# Patient Record
Sex: Female | Born: 1935 | Race: White | Hispanic: No | Smoking: Former smoker
Health system: Southern US, Community
[De-identification: ages and names within clinical notes are randomized; demographics above are authoritative.]

## PROBLEM LIST (undated history)

## (undated) DIAGNOSIS — F419 Anxiety disorder, unspecified: Secondary | ICD-10-CM

## (undated) DIAGNOSIS — K219 Gastro-esophageal reflux disease without esophagitis: Secondary | ICD-10-CM

## (undated) DIAGNOSIS — D510 Vitamin B12 deficiency anemia due to intrinsic factor deficiency: Secondary | ICD-10-CM

## (undated) DIAGNOSIS — I5032 Chronic diastolic (congestive) heart failure: Secondary | ICD-10-CM

## (undated) DIAGNOSIS — I10 Essential (primary) hypertension: Secondary | ICD-10-CM

## (undated) DIAGNOSIS — H34239 Retinal artery branch occlusion, unspecified eye: Secondary | ICD-10-CM

## (undated) DIAGNOSIS — F329 Major depressive disorder, single episode, unspecified: Secondary | ICD-10-CM

## (undated) DIAGNOSIS — M1990 Unspecified osteoarthritis, unspecified site: Secondary | ICD-10-CM

## (undated) DIAGNOSIS — M48061 Spinal stenosis, lumbar region without neurogenic claudication: Secondary | ICD-10-CM

## (undated) DIAGNOSIS — E785 Hyperlipidemia, unspecified: Secondary | ICD-10-CM

## (undated) DIAGNOSIS — J45909 Unspecified asthma, uncomplicated: Secondary | ICD-10-CM

## (undated) DIAGNOSIS — F32A Depression, unspecified: Secondary | ICD-10-CM

## (undated) HISTORY — DX: Spinal stenosis, lumbar region without neurogenic claudication: M48.061

## (undated) HISTORY — DX: Retinal artery branch occlusion, unspecified eye: H34.239

## (undated) HISTORY — DX: Gastro-esophageal reflux disease without esophagitis: K21.9

## (undated) HISTORY — DX: Chronic diastolic (congestive) heart failure: I50.32

## (undated) HISTORY — DX: Major depressive disorder, single episode, unspecified: F32.9

## (undated) HISTORY — DX: Unspecified osteoarthritis, unspecified site: M19.90

## (undated) HISTORY — DX: Hyperlipidemia, unspecified: E78.5

## (undated) HISTORY — DX: Anxiety disorder, unspecified: F41.9

## (undated) HISTORY — DX: Unspecified asthma, uncomplicated: J45.909

## (undated) HISTORY — DX: Vitamin B12 deficiency anemia due to intrinsic factor deficiency: D51.0

## (undated) HISTORY — DX: Essential (primary) hypertension: I10

## (undated) HISTORY — DX: Depression, unspecified: F32.A

## (undated) HISTORY — PX: TONSILLECTOMY: SUR1361

---

## 1982-08-20 HISTORY — PX: ABDOMINAL HYSTERECTOMY: SHX81

## 1997-08-20 HISTORY — PX: OTHER SURGICAL HISTORY: SHX169

## 2001-08-20 DIAGNOSIS — I10 Essential (primary) hypertension: Secondary | ICD-10-CM

## 2001-08-20 HISTORY — DX: Essential (primary) hypertension: I10

## 2004-06-07 ENCOUNTER — Ambulatory Visit: Payer: Self-pay | Admitting: Anesthesiology

## 2004-07-20 ENCOUNTER — Ambulatory Visit: Payer: Self-pay | Admitting: Neurosurgery

## 2005-01-11 ENCOUNTER — Ambulatory Visit: Payer: Self-pay | Admitting: Unknown Physician Specialty

## 2006-01-18 ENCOUNTER — Ambulatory Visit: Payer: Self-pay | Admitting: Unknown Physician Specialty

## 2006-07-02 ENCOUNTER — Encounter: Payer: Self-pay | Admitting: Internal Medicine

## 2007-03-06 ENCOUNTER — Ambulatory Visit: Payer: Self-pay | Admitting: Unknown Physician Specialty

## 2007-04-01 ENCOUNTER — Ambulatory Visit: Payer: Self-pay | Admitting: Internal Medicine

## 2007-04-01 DIAGNOSIS — M48061 Spinal stenosis, lumbar region without neurogenic claudication: Secondary | ICD-10-CM

## 2007-04-01 DIAGNOSIS — F411 Generalized anxiety disorder: Secondary | ICD-10-CM | POA: Insufficient documentation

## 2007-04-01 DIAGNOSIS — I10 Essential (primary) hypertension: Secondary | ICD-10-CM | POA: Insufficient documentation

## 2007-04-01 DIAGNOSIS — F329 Major depressive disorder, single episode, unspecified: Secondary | ICD-10-CM

## 2007-04-01 DIAGNOSIS — E785 Hyperlipidemia, unspecified: Secondary | ICD-10-CM | POA: Insufficient documentation

## 2007-04-01 DIAGNOSIS — K219 Gastro-esophageal reflux disease without esophagitis: Secondary | ICD-10-CM | POA: Insufficient documentation

## 2007-04-01 DIAGNOSIS — M1990 Unspecified osteoarthritis, unspecified site: Secondary | ICD-10-CM | POA: Insufficient documentation

## 2007-04-01 DIAGNOSIS — D510 Vitamin B12 deficiency anemia due to intrinsic factor deficiency: Secondary | ICD-10-CM

## 2007-04-04 ENCOUNTER — Encounter: Payer: Self-pay | Admitting: Internal Medicine

## 2007-04-26 ENCOUNTER — Encounter: Payer: Self-pay | Admitting: Internal Medicine

## 2007-04-29 ENCOUNTER — Ambulatory Visit: Payer: Self-pay | Admitting: Internal Medicine

## 2007-05-30 ENCOUNTER — Ambulatory Visit: Payer: Self-pay | Admitting: Internal Medicine

## 2007-06-30 ENCOUNTER — Ambulatory Visit: Payer: Self-pay | Admitting: Internal Medicine

## 2007-07-25 ENCOUNTER — Telehealth (INDEPENDENT_AMBULATORY_CARE_PROVIDER_SITE_OTHER): Payer: Self-pay | Admitting: *Deleted

## 2007-07-28 ENCOUNTER — Telehealth: Payer: Self-pay | Admitting: Internal Medicine

## 2007-07-29 ENCOUNTER — Telehealth (INDEPENDENT_AMBULATORY_CARE_PROVIDER_SITE_OTHER): Payer: Self-pay | Admitting: *Deleted

## 2007-08-04 ENCOUNTER — Ambulatory Visit: Payer: Self-pay | Admitting: Internal Medicine

## 2007-08-04 LAB — CONVERTED CEMR LAB
Albumin: 4 g/dL (ref 3.5–5.2)
BUN: 34 mg/dL — ABNORMAL HIGH (ref 6–23)
CO2: 25 meq/L (ref 19–32)
Eosinophils Absolute: 0.3 10*3/uL (ref 0.0–0.6)
GFR calc non Af Amer: 42 mL/min
Hemoglobin: 11.5 g/dL — ABNORMAL LOW (ref 12.0–15.0)
Lymphocytes Relative: 27.5 % (ref 12.0–46.0)
MCV: 86.2 fL (ref 78.0–100.0)
Monocytes Absolute: 1.1 10*3/uL — ABNORMAL HIGH (ref 0.2–0.7)
Monocytes Relative: 11.6 % — ABNORMAL HIGH (ref 3.0–11.0)
Neutro Abs: 5.7 10*3/uL (ref 1.4–7.7)
Phosphorus: 3.6 mg/dL (ref 2.3–4.6)
Platelets: 224 10*3/uL (ref 150–400)
Potassium: 4.9 meq/L (ref 3.5–5.1)
Sodium: 139 meq/L (ref 135–145)

## 2007-09-04 ENCOUNTER — Ambulatory Visit: Payer: Self-pay | Admitting: Internal Medicine

## 2007-10-07 ENCOUNTER — Ambulatory Visit: Payer: Self-pay | Admitting: Internal Medicine

## 2007-10-29 ENCOUNTER — Ambulatory Visit: Payer: Self-pay | Admitting: Internal Medicine

## 2007-10-29 ENCOUNTER — Telehealth (INDEPENDENT_AMBULATORY_CARE_PROVIDER_SITE_OTHER): Payer: Self-pay | Admitting: *Deleted

## 2007-10-29 DIAGNOSIS — E119 Type 2 diabetes mellitus without complications: Secondary | ICD-10-CM | POA: Insufficient documentation

## 2007-10-29 DIAGNOSIS — T7840XA Allergy, unspecified, initial encounter: Secondary | ICD-10-CM | POA: Insufficient documentation

## 2007-10-29 LAB — CONVERTED CEMR LAB: Glucose, Bld: 213 mg/dL

## 2007-11-07 ENCOUNTER — Ambulatory Visit: Payer: Self-pay | Admitting: Internal Medicine

## 2007-12-08 ENCOUNTER — Ambulatory Visit: Payer: Self-pay | Admitting: Internal Medicine

## 2008-01-08 ENCOUNTER — Ambulatory Visit: Payer: Self-pay | Admitting: Internal Medicine

## 2008-02-09 ENCOUNTER — Ambulatory Visit: Payer: Self-pay | Admitting: Internal Medicine

## 2008-02-10 LAB — CONVERTED CEMR LAB
ALT: 12 units/L (ref 0–35)
AST: 14 units/L (ref 0–37)
Albumin: 3.9 g/dL (ref 3.5–5.2)
Alkaline Phosphatase: 48 units/L (ref 39–117)
BUN: 30 mg/dL — ABNORMAL HIGH (ref 6–23)
Basophils Relative: 0.9 % (ref 0.0–1.0)
Bilirubin, Direct: 0.1 mg/dL (ref 0.0–0.3)
Calcium: 9.1 mg/dL (ref 8.4–10.5)
Chloride: 105 meq/L (ref 96–112)
Creatinine, Ser: 1.2 mg/dL (ref 0.4–1.2)
Eosinophils Absolute: 0.5 10*3/uL (ref 0.0–0.7)
Eosinophils Relative: 6.1 % — ABNORMAL HIGH (ref 0.0–5.0)
GFR calc Af Amer: 56 mL/min
GFR calc non Af Amer: 46 mL/min
HDL: 43.2 mg/dL (ref >39.0)
Lymphocytes Relative: 29.8 % (ref 12.0–46.0)
MCV: 87.3 fL (ref 78.0–100.0)
Monocytes Relative: 11.9 % (ref 3.0–12.0)
Neutrophils Relative %: 51.3 % (ref 43.0–77.0)
Phosphorus: 3.5 mg/dL (ref 2.3–4.6)
Platelets: 216 10*3/uL (ref 150–400)
RBC: 3.83 M/uL — ABNORMAL LOW (ref 3.87–5.11)
Total CHOL/HDL Ratio: 3.8
Total Protein: 6.7 g/dL (ref 6.0–8.3)
VLDL: 40 mg/dL (ref 0–40)
WBC: 8.5 10*3/uL (ref 4.5–10.5)

## 2008-02-18 ENCOUNTER — Ambulatory Visit: Payer: Self-pay | Admitting: Family Medicine

## 2008-03-24 ENCOUNTER — Ambulatory Visit: Payer: Self-pay | Admitting: Internal Medicine

## 2008-03-30 ENCOUNTER — Encounter: Payer: Self-pay | Admitting: Internal Medicine

## 2008-03-30 ENCOUNTER — Telehealth (INDEPENDENT_AMBULATORY_CARE_PROVIDER_SITE_OTHER): Payer: Self-pay | Admitting: *Deleted

## 2008-04-29 ENCOUNTER — Ambulatory Visit: Payer: Self-pay | Admitting: Internal Medicine

## 2008-04-29 ENCOUNTER — Telehealth: Payer: Self-pay | Admitting: Internal Medicine

## 2008-05-11 ENCOUNTER — Encounter: Payer: Self-pay | Admitting: Internal Medicine

## 2008-05-11 ENCOUNTER — Ambulatory Visit: Payer: Self-pay | Admitting: Internal Medicine

## 2008-05-12 ENCOUNTER — Encounter (INDEPENDENT_AMBULATORY_CARE_PROVIDER_SITE_OTHER): Payer: Self-pay | Admitting: *Deleted

## 2008-06-07 ENCOUNTER — Ambulatory Visit: Payer: Self-pay | Admitting: Internal Medicine

## 2008-06-07 LAB — CONVERTED CEMR LAB
Albumin: 3.8 g/dL (ref 3.5–5.2)
BUN: 33 mg/dL — ABNORMAL HIGH (ref 6–23)
CO2: 26 meq/L (ref 19–32)
Calcium: 9.1 mg/dL (ref 8.4–10.5)
GFR calc non Af Amer: 42 mL/min
Glucose, Bld: 101 mg/dL — ABNORMAL HIGH (ref 70–99)
Sodium: 140 meq/L (ref 135–145)

## 2008-06-15 ENCOUNTER — Ambulatory Visit: Payer: Self-pay

## 2008-06-15 ENCOUNTER — Encounter: Payer: Self-pay | Admitting: Internal Medicine

## 2008-07-12 ENCOUNTER — Ambulatory Visit: Payer: Self-pay | Admitting: Internal Medicine

## 2008-08-16 ENCOUNTER — Ambulatory Visit: Payer: Self-pay | Admitting: Internal Medicine

## 2008-09-22 ENCOUNTER — Ambulatory Visit: Payer: Self-pay | Admitting: Internal Medicine

## 2008-10-04 ENCOUNTER — Ambulatory Visit: Payer: Self-pay | Admitting: Internal Medicine

## 2008-10-05 LAB — CONVERTED CEMR LAB
AST: 13 units/L (ref 0–37)
Alkaline Phosphatase: 45 units/L (ref 39–117)
BUN: 31 mg/dL — ABNORMAL HIGH (ref 6–23)
Basophils Absolute: 0.1 10*3/uL (ref 0.0–0.1)
Bilirubin, Direct: 0.1 mg/dL (ref 0.0–0.3)
CO2: 26 meq/L (ref 19–32)
Calcium: 9.3 mg/dL (ref 8.4–10.5)
Chloride: 108 meq/L (ref 96–112)
Creatinine, Ser: 1.2 mg/dL (ref 0.4–1.2)
Eosinophils Absolute: 0.6 10*3/uL (ref 0.0–0.7)
Eosinophils Relative: 6.2 % — ABNORMAL HIGH (ref 0.0–5.0)
HDL: 43.4 mg/dL (ref >39.0)
Lymphocytes Relative: 28.2 % (ref 12.0–46.0)
MCHC: 33.5 g/dL (ref 30.0–36.0)
MCV: 87.4 fL (ref 78.0–100.0)
Neutrophils Relative %: 51.1 % (ref 43.0–77.0)
Platelets: 197 10*3/uL (ref 150–400)
TSH: 1.48 microintl units/mL (ref 0.35–5.50)
Triglycerides: 203 mg/dL (ref 0–149)
VLDL: 41 mg/dL — ABNORMAL HIGH (ref 0–40)
WBC: 9.3 10*3/uL (ref 4.5–10.5)

## 2008-10-26 ENCOUNTER — Ambulatory Visit: Payer: Self-pay | Admitting: Internal Medicine

## 2008-10-27 ENCOUNTER — Encounter: Payer: Self-pay | Admitting: Internal Medicine

## 2008-10-29 ENCOUNTER — Telehealth: Payer: Self-pay | Admitting: Family Medicine

## 2008-11-16 ENCOUNTER — Telehealth: Payer: Self-pay | Admitting: Internal Medicine

## 2008-12-02 ENCOUNTER — Ambulatory Visit: Payer: Self-pay | Admitting: Internal Medicine

## 2009-01-05 ENCOUNTER — Ambulatory Visit: Payer: Self-pay | Admitting: Internal Medicine

## 2009-02-09 ENCOUNTER — Ambulatory Visit: Payer: Self-pay | Admitting: Internal Medicine

## 2009-04-11 ENCOUNTER — Ambulatory Visit: Payer: Self-pay | Admitting: Internal Medicine

## 2009-04-12 LAB — CONVERTED CEMR LAB
AST: 17 units/L (ref 0–37)
Basophils Relative: 0.7 % (ref 0.0–3.0)
CO2: 26 meq/L (ref 19–32)
Chloride: 110 meq/L (ref 96–112)
Creatinine, Ser: 1.4 mg/dL — ABNORMAL HIGH (ref 0.4–1.2)
Direct LDL: 135 mg/dL
Eosinophils Relative: 7.3 % — ABNORMAL HIGH (ref 0.0–5.0)
HCT: 33.1 % — ABNORMAL LOW (ref 36.0–46.0)
Hemoglobin: 10.8 g/dL — ABNORMAL LOW (ref 12.0–15.0)
MCV: 88.9 fL (ref 78.0–100.0)
Monocytes Absolute: 1 10*3/uL (ref 0.1–1.0)
Neutro Abs: 3.7 10*3/uL (ref 1.4–7.7)
Neutrophils Relative %: 48.5 % (ref 43.0–77.0)
Potassium: 4.5 meq/L (ref 3.5–5.1)
RBC: 3.72 M/uL — ABNORMAL LOW (ref 3.87–5.11)
Sodium: 142 meq/L (ref 135–145)
Total Bilirubin: 1.1 mg/dL (ref 0.3–1.2)
Total CHOL/HDL Ratio: 5
Triglycerides: 207 mg/dL — ABNORMAL HIGH (ref 0.0–149.0)
WBC: 7.8 10*3/uL (ref 4.5–10.5)

## 2009-04-26 ENCOUNTER — Telehealth: Payer: Self-pay | Admitting: Internal Medicine

## 2009-05-20 ENCOUNTER — Ambulatory Visit: Payer: Self-pay | Admitting: Internal Medicine

## 2009-06-14 ENCOUNTER — Telehealth: Payer: Self-pay | Admitting: Internal Medicine

## 2009-09-07 ENCOUNTER — Ambulatory Visit: Payer: Self-pay | Admitting: Internal Medicine

## 2009-09-09 LAB — CONVERTED CEMR LAB
AST: 18 units/L (ref 0–37)
Albumin: 3.9 g/dL (ref 3.5–5.2)
Alkaline Phosphatase: 43 units/L (ref 39–117)
Basophils Relative: 1.1 % (ref 0.0–3.0)
Chloride: 110 meq/L (ref 96–112)
Eosinophils Relative: 7 % — ABNORMAL HIGH (ref 0.0–5.0)
GFR calc non Af Amer: 46.17 mL/min (ref >60)
Hemoglobin: 10.5 g/dL — ABNORMAL LOW (ref 12.0–15.0)
Hgb A1c MFr Bld: 6.2 % (ref 4.6–6.5)
Lymphocytes Relative: 29.2 % (ref 12.0–46.0)
Monocytes Relative: 10.6 % (ref 3.0–12.0)
Neutro Abs: 3.6 10*3/uL (ref 1.4–7.7)
Phosphorus: 3.6 mg/dL (ref 2.3–4.6)
Potassium: 5.1 meq/L (ref 3.5–5.1)
RBC: 3.6 M/uL — ABNORMAL LOW (ref 3.87–5.11)
TSH: 1.5 microintl units/mL (ref 0.35–5.50)
Total Protein: 6.7 g/dL (ref 6.0–8.3)

## 2009-12-05 ENCOUNTER — Telehealth: Payer: Self-pay | Admitting: Internal Medicine

## 2009-12-14 ENCOUNTER — Ambulatory Visit: Payer: Self-pay | Admitting: Internal Medicine

## 2009-12-14 DIAGNOSIS — R0609 Other forms of dyspnea: Secondary | ICD-10-CM

## 2009-12-14 DIAGNOSIS — R0989 Other specified symptoms and signs involving the circulatory and respiratory systems: Secondary | ICD-10-CM | POA: Insufficient documentation

## 2009-12-23 ENCOUNTER — Encounter: Payer: Self-pay | Admitting: Internal Medicine

## 2010-01-09 ENCOUNTER — Ambulatory Visit: Payer: Self-pay | Admitting: Internal Medicine

## 2010-01-09 DIAGNOSIS — J45909 Unspecified asthma, uncomplicated: Secondary | ICD-10-CM | POA: Insufficient documentation

## 2010-02-06 ENCOUNTER — Ambulatory Visit: Payer: Self-pay | Admitting: Internal Medicine

## 2010-02-14 ENCOUNTER — Telehealth: Payer: Self-pay | Admitting: Internal Medicine

## 2010-02-16 ENCOUNTER — Telehealth: Payer: Self-pay | Admitting: Internal Medicine

## 2010-03-08 ENCOUNTER — Encounter: Payer: Self-pay | Admitting: Internal Medicine

## 2010-04-05 ENCOUNTER — Encounter: Payer: Self-pay | Admitting: Internal Medicine

## 2010-04-11 ENCOUNTER — Encounter: Payer: Self-pay | Admitting: Internal Medicine

## 2010-04-19 ENCOUNTER — Ambulatory Visit: Payer: Self-pay | Admitting: Internal Medicine

## 2010-04-20 HISTORY — PX: TOTAL KNEE ARTHROPLASTY: SHX125

## 2010-04-25 ENCOUNTER — Telehealth: Payer: Self-pay | Admitting: Internal Medicine

## 2010-04-28 ENCOUNTER — Ambulatory Visit: Payer: Self-pay | Admitting: Orthopedic Surgery

## 2010-05-01 ENCOUNTER — Ambulatory Visit: Payer: Self-pay

## 2010-05-04 ENCOUNTER — Inpatient Hospital Stay: Payer: Self-pay | Admitting: Orthopedic Surgery

## 2010-06-28 ENCOUNTER — Emergency Department: Payer: Self-pay | Admitting: Unknown Physician Specialty

## 2010-06-28 ENCOUNTER — Encounter: Payer: Self-pay | Admitting: Internal Medicine

## 2010-07-07 ENCOUNTER — Ambulatory Visit: Payer: Self-pay | Admitting: Internal Medicine

## 2010-07-08 ENCOUNTER — Encounter: Payer: Self-pay | Admitting: Internal Medicine

## 2010-08-01 ENCOUNTER — Encounter: Payer: Self-pay | Admitting: Internal Medicine

## 2010-09-17 LAB — CONVERTED CEMR LAB
Albumin: 4 g/dL (ref 3.5–5.2)
BUN: 22 mg/dL (ref 6–23)
Basophils Absolute: 0.1 10*3/uL (ref 0.0–0.1)
Basophils Absolute: 0.1 10*3/uL (ref 0.0–0.1)
Basophils Absolute: 0.1 10*3/uL (ref 0.0–0.1)
Bilirubin Urine: NEGATIVE
CO2: 23 meq/L (ref 19–32)
CO2: 25 meq/L (ref 19–32)
CO2: 26 meq/L (ref 19–32)
Calcium: 8.8 mg/dL (ref 8.4–10.5)
Chloride: 107 meq/L (ref 96–112)
Chloride: 111 meq/L (ref 96–112)
Cholesterol: 159 mg/dL (ref 0–200)
Creatinine, Ser: 1.5 mg/dL — ABNORMAL HIGH (ref 0.4–1.2)
Eosinophils Absolute: 0.5 10*3/uL (ref 0.0–0.6)
Eosinophils Absolute: 0.6 10*3/uL (ref 0.0–0.7)
GFR calc Af Amer: 62 mL/min
GFR calc non Af Amer: 35.66 mL/min (ref >60)
Glucose, Bld: 100 mg/dL — ABNORMAL HIGH (ref 70–99)
Glucose, Bld: 93 mg/dL (ref 70–99)
Glucose, Bld: 98 mg/dL (ref 70–99)
HCT: 27.6 % — ABNORMAL LOW (ref 36.0–46.0)
HCT: 30.5 % — ABNORMAL LOW (ref 36.0–46.0)
HCT: 32.7 % — ABNORMAL LOW (ref 36.0–46.0)
Hemoglobin, Urine: NEGATIVE
Hemoglobin: 10.9 g/dL — ABNORMAL LOW (ref 12.0–15.0)
Hemoglobin: 9.4 g/dL — ABNORMAL LOW (ref 12.0–15.0)
Ketones, ur: NEGATIVE mg/dL
Lymphocytes Relative: 29.6 % (ref 12.0–46.0)
Lymphocytes Relative: 34.5 % (ref 12.0–46.0)
Lymphs Abs: 2.3 10*3/uL (ref 0.7–4.0)
Lymphs Abs: 2.3 10*3/uL (ref 0.7–4.0)
MCHC: 33.4 g/dL (ref 30.0–36.0)
MCHC: 34 g/dL (ref 30.0–36.0)
MCV: 85.7 fL (ref 78.0–100.0)
MCV: 88.5 fL (ref 78.0–100.0)
Monocytes Absolute: 0.9 10*3/uL — ABNORMAL HIGH (ref 0.2–0.7)
Monocytes Absolute: 1 10*3/uL (ref 0.1–1.0)
Monocytes Relative: 9.6 % (ref 3.0–12.0)
Neutro Abs: 3.4 10*3/uL (ref 1.4–7.7)
Neutro Abs: 4.6 10*3/uL (ref 1.4–7.7)
Neutrophils Relative %: 46 % (ref 43.0–77.0)
Nitrite: NEGATIVE
Phosphorus: 3.2 mg/dL (ref 2.3–4.6)
Phosphorus: 5.1 mg/dL — ABNORMAL HIGH (ref 2.3–4.6)
Platelets: 192 10*3/uL (ref 150.0–400.0)
Platelets: 212 10*3/uL (ref 150.0–400.0)
Potassium: 4.7 meq/L (ref 3.5–5.1)
Potassium: 4.8 meq/L (ref 3.5–5.1)
Potassium: 5.2 meq/L — ABNORMAL HIGH (ref 3.5–5.1)
Pro B Natriuretic peptide (BNP): 83 pg/mL (ref 0.0–100.0)
RBC: 3.82 M/uL — ABNORMAL LOW (ref 3.87–5.11)
RDW: 13.1 % (ref 11.5–14.6)
RDW: 13.4 % (ref 11.5–14.6)
Sodium: 141 meq/L (ref 135–145)
Sodium: 142 meq/L (ref 135–145)
Sodium: 143 meq/L (ref 135–145)
Total CHOL/HDL Ratio: 3.6
Transferrin: 257.1 mg/dL (ref 212.0–360.0)

## 2010-09-21 NOTE — Assessment & Plan Note (Signed)
Summary: ROA FOR FOLLOW-UP/JRR   Vital Signs:  Patient profile:   77 year old female Weight:      209 pounds O2 Sat:      96 % on Room air Temp:     98.8 degrees F oral Pulse rate:   86 / minute Pulse rhythm:   regular BP sitting:   138 / 62  (left arm) Cuff size:   large  Vitals Entered By: Mervin Hack CMA Duncan Dull) (February 06, 2010 11:25 AM)  O2 Flow:  Room air CC: follow-up visit   History of Present Illness: Feels better with advair Not as much SOB daughter also notes a difference Out to [place] with lots of walking and did fairly well Now she wonders whether she still needs it--per daughter may have just a little AM cough--nothing bad xopenex does help but too expensive  never used albuterol in past  asks about losing some of her meds no heart trouble needs naproxen all the time for arthritis----tries to limit hydrocodone use reviewed evidence for primary prevention of MI  Allergies: 1)  ! Phenergan 2)  ! Sulfa 3)  Meloxicam (Meloxicam)  Past History:  Past medical, surgical, family and social histories (including risk factors) reviewed for relevance to current acute and chronic problems.  Past Medical History: Reviewed history from 01/09/2010 and no changes required. Anxiety Depression GERD Hyperlipidemia Hypertension-- ~2003 Osteoarthritis--knees Lumbar spinal stenosis Pernicious anemia Diabetes mellitus, type II Asthma  Past Surgical History: Reviewed history from 04/01/2007 and no changes required. 1999 Ruptured disk repaired 1984 Hysterectomy Child--tonsillectomy  Family History: Reviewed history from 04/01/2007 and no changes required. Dad died of emphysema @78  Mom died of lung cancer@87  3 brothers --1 with COPD 2 sisters--1 with HTN, balance issues No CAD No other cancer  Social History: Reviewed history from 10/04/2008 and no changes required. 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   Neck:  supple, no masses, no thyromegaly, no carotid bruits, and no cervical lymphadenopathy.   Lungs:  normal respiratory effort, no intercostal retractions, no accessory muscle use, normal breath sounds, no crackles, and no wheezes.   Heart:  normal rate, regular rhythm, no murmur, and no gallop.   Extremities:  no edema Psych:  normally interactive, good eye contact, not anxious appearing, and not depressed appearing.     Impression & Recommendations:  Problem # 1:  ASTHMA (ICD-493.90) Assessment Improved doing better now needs to continue the advair changed rescue inhaler for reduced cost  The following medications were removed from the medication list:    Xopenex Hfa 45 Mcg/act Aero (Levalbuterol tartrate) .Marland Kitchen... 2 puffs by mouth two times a day as needed Her updated medication list for this problem includes:    Advair Diskus 100-50 Mcg/dose Aepb (Fluticasone-salmeterol) .Marland Kitchen... 1 dose inhaled two times a day. please rinse mouth after    Proair Hfa 108 (90 Base) Mcg/act Aers (Albuterol sulfate) .Marland Kitchen... 2 puffs four times daily as needed for shortness of breath  Problem # 2:  OSTEOARTHRITIS (ICD-715.90) Assessment: Unchanged ongoing issues good response from naproxen takes 1/2 hydrocodone as needed ---1 makes her sleepy  Her updated medication list for this problem includes:    Vicodin  5-500 Mg Tabs (Hydrocodone-acetaminophen) .Marland Kitchen... 1/2 - 1 three times a day as needed for severe pain    Naproxen Dr 500 Mg Tbec (Naproxen) .Marland Kitchen... 1 two times a day for knee or back pain  Problem # 3:  HYPERLIPIDEMIA (ICD-272.4) Assessment: Unchanged discussed uncertainty of primary prevention she will continue for now  Her  updated medication list for this problem includes:    Simvastatin 40 Mg Tabs (Simvastatin) .Marland Kitchen... Take 1 tablet by mouth once a day  Labs Reviewed: SGOT: 18 (09/07/2009)   SGPT: 12 (09/07/2009)   HDL:41.40 (04/11/2009), 43.4 (10/04/2008)  LDL:DEL (10/04/2008), 80 (19/14/7829)  Chol:204 (04/11/2009), 173 (10/04/2008)  Trig:207.0 (04/11/2009), 203 (10/04/2008)  Problem # 4:  HYPERTENSION (ICD-401.9) Assessment: Unchanged combine meds to reduce number of pills  The following medications were removed from the medication list:    Triamterene-hctz 37.5-25 Mg Tabs (Triamterene-hctz) .Marland Kitchen... 1 daily    Lisinopril 20 Mg Tabs (Lisinopril) .Marland Kitchen... 1 daily Her updated medication list for this problem includes:    Lisinopril-hydrochlorothiazide 20-25 Mg Tabs (Lisinopril-hydrochlorothiazide) .Marland Kitchen... 1 tab daily for high blood pressure  Complete Medication List: 1)  Lisinopril-hydrochlorothiazide 20-25 Mg Tabs (Lisinopril-hydrochlorothiazide) .Marland Kitchen.. 1 tab daily for high blood pressure 2)  Simvastatin 40 Mg Tabs (Simvastatin) .... Take 1 tablet by mouth once a day 3)  Citalopram Hydrobromide 40 Mg Tabs (Citalopram hydrobromide) .Marland Kitchen.. 1 tab   daily 4)  Vicodin 5-500 Mg Tabs (Hydrocodone-acetaminophen) .... 1/2 - 1 three times a day as needed for severe pain 5)  Advair Diskus 100-50 Mcg/dose Aepb (Fluticasone-salmeterol) .Marland Kitchen.. 1 dose inhaled two times a day. please rinse mouth after 6)  Naproxen Dr 500 Mg Tbec (Naproxen) .Marland Kitchen.. 1 two times a day for knee or back pain 7)  Omeprazole 40 Mg Cpdr (Omeprazole) .... Take 1 by mouth once daily 8)  Cyanocobalamin 1000 Mcg/ml Soln (Cyanocobalamin) .... Inject 1ml monthly 9)  Multivitamins Tabs (Multiple vitamin) 10)  Proair Hfa 108 (90 Base) Mcg/act Aers (Albuterol sulfate) .... 2 puffs four times daily as needed for shortness of breath  Patient Instructions: 1)  Please schedule a follow-up appointment in 4 months .  Prescriptions: LISINOPRIL-HYDROCHLOROTHIAZIDE 20-25 MG  TABS (LISINOPRIL-HYDROCHLOROTHIAZIDE) 1 tab daily for high blood pressure  #30 x 12   Entered and Authorized by:   Cindee Salt MD   Signed by:   Cindee Salt MD on 02/06/2010   Method used:   [facility]        [id number]       [address]       [place], [place]  [address]       Ph: [phone] or [phone]       Fax: [phone]   RxID:   [id number] PROAIR HFA 108 (90 BASE) MCG/ACT AERS (ALBUTEROL SULFATE) 2 puffs four times daily as needed for shortness of breath  #1 x 3   Entered and Authorized by:   Cindee Salt MD   Signed by:   Cindee Salt MD on 02/06/2010   Method used:   [facility]        [id number]       [address]       [place], [place]  [address]       Ph: [phone] or [phone]       Fax:  [phone]   RxID:   [id number]   Current Allergies (reviewed today): ! PHENERGAN ! SULFA MELOXICAM (MELOXICAM)

## 2010-09-21 NOTE — Assessment & Plan Note (Signed)
Summary: F/U DLO   Vital Signs:  Patient profile:   77 year old female Weight:      196 pounds Temp:     98.1 degrees F oral Pulse rate:   84 / minute Pulse rhythm:   regular BP sitting:   138 / 70  (left arm) Cuff size:   large  Vitals Entered By: Mervin Hack CMA Duncan Dull) (July 07, 2010 2:24 PM) CC: follow-up visit   History of Present Illness: Doing well Recovering fairly well from right TKR walking with cane No pain on right--only has pain in left knee now  Chronic anemia Did get 2 units pRBC after the procedure but was anemic before 2 spells of vertigo in the past week went to ER and got CT scan, etc and everything was okay 2nd time son came over and helped has meclizine to use as needed  Recent visit with Dr Reola Calkins okay  No chest pain No SOB No edema now  Had some anxiety while in health care calmer now no sig depression Ongoing stress --has had to establish boundaries with kids  Hasn't been checking sugars was 108 in the ambulance with the vertigo always fine in health care  Allergies: 1)  ! Phenergan 2)  ! Sulfa 3)  Meloxicam (Meloxicam)  Past History:  Past medical, surgical, family and social histories (including risk factors) reviewed for relevance to current acute and chronic problems.  Past Medical History: Reviewed history from 01/09/2010 and no changes required. Anxiety Depression GERD Hyperlipidemia Hypertension-- ~2003 Osteoarthritis--knees Lumbar spinal stenosis Pernicious anemia Diabetes mellitus, type II Asthma  Past Surgical History: 1999 Ruptured disk repaired 1984 Hysterectomy Child--tonsillectomy 9/11 Right TKR   Dr Rosita Kea  Family History: Reviewed history from 04/01/2007 and no changes required. Dad died of emphysema @78  Mom died of lung cancer@87  3 brothers --1 with COPD 2 sisters--1 with HTN, balance issues No CAD No other cancer  Social History: Reviewed history from 10/04/2008 and no changes  required. 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   Neck:  supple, no masses, no thyromegaly, and no cervical lymphadenopathy.   Lungs:  normal respiratory effort, no intercostal retractions, no accessory muscle use, and normal breath sounds.   Heart:  normal rate, regular rhythm, no murmur, and no gallop.   Abdomen:  soft and non-tender.   Extremities:  no edema Psych:  normally interactive, good eye contact, not anxious appearing, and not depressed appearing.     Impression & Recommendations:  Problem # 1:  HYPERTENSION (ICD-401.9) Assessment Unchanged good control no changes needed  Her updated medication list for this problem includes:    Lisinopril-hydrochlorothiazide 20-25 Mg Tabs (Lisinopril-hydrochlorothiazide) .Marland Kitchen... 1 tab daily for high blood pressure  BP today: 138/70 Prior BP: 150/70 (04/19/2010)  Labs Reviewed: K+: 4.7 (04/19/2010) Creat: : 1.5 (04/19/2010)   Chol: 204 (04/11/2009)   HDL: 41.40 (04/11/2009)   LDL: DEL (10/04/2008)   TG: 207.0 (04/11/2009)  Problem # 2:  DIABETES MELLITUS, TYPE II (ICD-250.00) Assessment: Unchanged good control no meds  Her updated medication list for this problem includes:    Lisinopril-hydrochlorothiazide  20-25 Mg Tabs (Lisinopril-hydrochlorothiazide) .Marland Kitchen... 1 tab daily for high blood pressure  Labs Reviewed: Creat: 1.5 (04/19/2010)     Last Eye Exam: no retinopathy (09/20/2008) Reviewed HgBA1c results: 6.1 (12/14/2009)  6.2 (09/07/2009)  Problem # 3:  OSTEOARTHRITIS (ICD-715.90) Assessment: Improved right knee much  better still with pain in left  The following medications were removed from the medication list:    Naproxen Dr 500 Mg Tbec (Naproxen) .Marland Kitchen... 1 two times a day for knee or back pain Her updated medication list for this problem includes:    Vicodin 5-500 Mg Tabs (Hydrocodone-acetaminophen) .Marland Kitchen... 1/2 - 1 three times a day as needed for severe pain  Problem # 4:  DEPRESSION (ICD-311) Assessment: Unchanged mood has been fine will continue the medication  Her updated medication list for this problem includes:    Citalopram Hydrobromide 40 Mg Tabs (Citalopram hydrobromide) .Marland Kitchen... 1 tab   daily  Complete Medication List: 1)  Lisinopril-hydrochlorothiazide 20-25 Mg Tabs (Lisinopril-hydrochlorothiazide) .Marland Kitchen.. 1 tab daily for high blood pressure 2)  Simvastatin 40 Mg Tabs (Simvastatin) .... Take 1 tablet by mouth once a day 3)  Citalopram Hydrobromide 40 Mg Tabs (Citalopram hydrobromide) .Marland Kitchen.. 1 tab   daily 4)  Vicodin 5-500 Mg Tabs (Hydrocodone-acetaminophen) .... 1/2 - 1 three times a day as needed for severe pain 5)  Advair Diskus 100-50 Mcg/dose Aepb (Fluticasone-salmeterol) .Marland Kitchen.. 1 dose inhaled two times a day. please rinse mouth after 6)  Omeprazole 40 Mg Cpdr (Omeprazole) .... Take 1 by mouth once daily 7)  Cyanocobalamin 1000 Mcg/ml Soln (Cyanocobalamin) .... Inject 1ml monthly 8)  Multivitamins Tabs (Multiple vitamin) 9)  Proair Hfa 108 (90 Base) Mcg/act Aers (Albuterol sulfate) .... 2 puffs four times daily as needed for shortness of breath  Patient Instructions: 1)  Please schedule a follow-up appointment in 4 months .    Orders Added: 1)  Est. Patient Level IV [04540]    Current Allergies (reviewed today): ! PHENERGAN ! SULFA MELOXICAM (MELOXICAM)

## 2010-09-21 NOTE — Progress Notes (Signed)
Summary: refill request for vicodin  Phone Note Refill Request Message from:  Fax from Pharmacy  Refills Requested: Medication #1:  VICODIN 5-500 MG  TABS 1/2 - 1 three times a day as needed for severe pain   Last Refilled: 12/05/2009 Faxed request for vicodin is on your desk.  Initial call taken by: Lowella Petties CMA,  February 16, 2010 2:39 PM  Follow-up for Phone Call        okay #90 x 0 Follow-up by: Cindee Salt MD,  February 16, 2010 4:03 PM  Additional Follow-up for Phone Call Additional follow up Details #1::        Faxed request form faxed to [facility] pharmacy [phone] as instructed.Lewanda Rife LPN  February 16, 2010 5:08 PM      Appended Document: refill request for vicodin Faxed request was faxed to [phone] which is [facility]. [facility].

## 2010-09-21 NOTE — Letter (Signed)
Summary: [facility]  [facility]   Imported By: Lanelle Bal 08/22/2010 09:07:09  _____________________________________________________________________  External Attachment:    Type:   Image     Comment:   External Document  Appended Document: [facility] doing well after right TKR

## 2010-09-21 NOTE — Progress Notes (Signed)
Summary: VICODIN  Phone Note [facility] Request [id number] on April 25, 2010 10:57 AM  Refills Requested: Medication #1:  VICODIN 5-500 MG  TABS 1/2 - 1 three times a day as needed for severe pain   Last Refilled: 02/16/2010 Form on your desk    Method Requested: Fax to Local Pharmacy Initial call taken by: DeShannon Smith CMA Duncan Dull),  April 25, 2010 10:57 AM  Follow-up for Phone Call        okay #90 x 0 Follow-up by: Cindee Salt MD,  April 25, 2010 1:51 PM  Additional Follow-up for Phone Call Additional follow up Details #1::        Rx faxed to pharmacy Additional Follow-up by: DeShannon Smith CMA Duncan Dull),  April 25, 2010 2:16 PM    Prescriptions: VICODIN 5-500 MG  TABS (HYDROCODONE-ACETAMINOPHEN) 1/2 - 1 three times a day as needed for severe pain  #90 x 0   Entered by:   Mervin Hack CMA (AAMA)   Authorized by:   Cindee Salt MD   Signed by:   Mervin Hack CMA (AAMA) on 04/25/2010   Method used:   Handwritten   RxID:   [id number]

## 2010-09-21 NOTE — Assessment & Plan Note (Signed)
Summary: 3 M F/U DLO   Vital Signs:  Patient profile:   77 year old female Weight:      212 pounds O2 Sat:      96 % on Room air Temp:     99.1 degrees F oral Pulse rate:   74 / minute Pulse rhythm:   regular BP sitting:   138 / 80  (left arm) Cuff size:   large  Vitals Entered By: Mervin Hack CMA Duncan Dull) (December 14, 2009 12:05 PM)  O2 Flow:  Room air CC: 3 month follow-up/ loose stools   History of Present Illness: Daughter here with her  Not feeling as well Stays tired all the time Very limited exercise tolerance--hard even to walk across her [place] No chest pain No palpitations Notices a gradual worsening  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 Phenergan 2)  ! Sulfa 3)  Meloxicam (Meloxicam)  Past History:  Past medical, surgical, family and social histories (including risk factors) reviewed for relevance to current acute and chronic problems.  Past Medical History: Reviewed history from 10/29/2007 and no changes required. Anxiety Depression GERD Hyperlipidemia Hypertension-- ~2003 Osteoarthritis--knees Lumbar spinal stenosis Pernicious anemia Diabetes mellitus, type II  Past Surgical History: Reviewed history from 04/01/2007 and no changes required. 1999 Ruptured disk repaired 1984 Hysterectomy Child--tonsillectomy  Family History: Reviewed history from 04/01/2007 and no changes required. Dad died of emphysema @78  Mom died of lung cancer@87  3 brothers --1 with COPD 2 sisters--1 with HTN, balance issues No  CAD No other cancer  Social History: Reviewed history from 10/04/2008 and no changes required. 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   Neck:  supple, no masses, no thyromegaly, no carotid bruits, and no cervical lymphadenopathy.   Lungs:  normal respiratory effort, no intercostal retractions, no accessory muscle use, normal breath sounds, no crackles, and no wheezes.   Heart:  normal rate, regular rhythm, no murmur, and no gallop.   Abdomen:  soft, non-tender, and no masses.   Msk:  knees are thickened Decreased abduction of left shoulder Pulses:  faint in feet Extremities:  trace edema Psych:  normally interactive, good eye contact, dysphoric affect, and slightly anxious.   Additional Exam:  CXR negative spirometry-- normal or mild obstruction   Impression & Recommendations:  Problem # 1:  DYSPNEA/SHORTNESS OF BREATH (ICD-786.09) Assessment New Having sig change in stamina Could be CAD or CHF but may just be deconditioning arthritic pain is certainly playing a big part in her disability  P: check echo    consider chemical stress test    checking BNP  Her updated medication list for this problem includes:    Triamterene-hctz 37.5-25 Mg Tabs (Triamterene-hctz) .Marland Kitchen... 1 daily  Lisinopril 20 Mg Tabs (Lisinopril) .Marland Kitchen... 1 daily    Xopenex Hfa 45 Mcg/act Aero (Levalbuterol tartrate) .Marland Kitchen... 2 puffs by mouth two times a day as needed  Orders: TLB-CBC Platelet - w/Differential (85025-CBCD) TLB-Renal Function Panel (80069-RENAL) Venipuncture (81191) TLB-BNP (B-Natriuretic Peptide) (83880-BNPR) CXR- 2view (CXR) Spirometry w/Graph (94010) EKG w/  Interpretation (93000) Echo Referral (Echo)  Problem # 2:  OSTEOARTHRITIS (ICD-715.90) Assessment: Deteriorated worse esp in knees and back needs to take the hydrocodone  more regularly will consider injecting worse knee at follow up  Her updated medication list for this problem includes:    Naproxen Dr 500 Mg Tbec (Naproxen) .Marland Kitchen... 1 two times a day for knee or back pain    Vicodin 5-500 Mg Tabs (Hydrocodone-acetaminophen) .Marland Kitchen... 1/2 - 1 three times a day as needed for severe pain  Complete Medication List: 1)  Triamterene-hctz 37.5-25 Mg Tabs (Triamterene-hctz) .Marland Kitchen.. 1 daily 2)  Lisinopril 20 Mg Tabs (Lisinopril) .Marland Kitchen.. 1 daily 3)  Simvastatin 40 Mg Tabs (Simvastatin) .... Take 1 tablet by mouth once a day 4)  Naproxen Dr 500 Mg Tbec (Naproxen) .Marland Kitchen.. 1 two times a day for knee or back pain 5)  Multivitamins Tabs (Multiple vitamin) 6)  Citalopram Hydrobromide 40 Mg Tabs (Citalopram hydrobromide) .Marland Kitchen.. 1 tab   daily 7)  Vicodin 5-500 Mg Tabs (Hydrocodone-acetaminophen) .... 1/2 - 1 three times a day as needed for severe pain 8)  Cyanocobalamin 1000 Mcg/ml Soln (Cyanocobalamin) .... Inject 1ml monthly 9)  Omeprazole 40 Mg Cpdr (Omeprazole) .... Take 1 by mouth once daily 10)  Xopenex Hfa 45 Mcg/act Aero (Levalbuterol tartrate) .... 2 puffs by mouth two times a day as needed  Other Orders: Prescription Created Electronically [phone]) TLB-A1C / Hgb A1C (Glycohemoglobin) (83036-A1C) TLB-IBC Pnl (Iron/FE;Transferrin) (83550-IBC) TLB-Ferritin (82728-FER)  Patient Instructions: 1)  Please schedule a follow-up appointment in 2 weeks.  2)  Please schedule echocardiogram 3)  Please try taking the hydrocodone more often and see if you can get around any better Prescriptions: OMEPRAZOLE 40 MG CPDR (OMEPRAZOLE) take 1 by mouth once daily  #30 x 12   Entered by:   Mervin Hack CMA (AAMA)   Authorized by:   Cindee Salt MD   Signed by:   Mervin Hack CMA (AAMA) on 12/14/2009   Method  used:   [facility]        [id number]       [address]       [place], [place]  [address]       Ph: [phone] or [phone]       Fax: [phone]   RxID:   [id number]   Current Allergies (reviewed today): ! PHENERGAN ! SULFA MELOXICAM (MELOXICAM)   EKG  Procedure date:  12/14/2009  Findings:      sinus @75  LBBB which is not new from 2006

## 2010-09-21 NOTE — Assessment & Plan Note (Signed)
Summary: surgical clearance/alc   Vital Signs:  Patient profile:   77 year old female Height:      63 inches Weight:      213 pounds O2 Sat:      94 % on Room air Temp:     98.7 degrees F oral Pulse rate:   82 / minute Pulse rhythm:   regular Resp:     14 per minute BP sitting:   150 / 70  (left arm) Cuff size:   large  Vitals Entered By: Mervin Hack CMA Duncan Dull) (April 19, 2010 10:48 AM)  O2 Flow:  Room air CC: surgical clearance   History of Present Illness: Plans Left TKR now--Dr Rosita Kea May need the right done also but not sure ongoing severe pain Planning for next week  Dr Gwen Pounds has already done cardiac clearance Low risk  Did fall a few weeks ago--tripped and went head first on sidewalk had a lot of facial bruising Right radial injury ---"cracked". No surgery needed  Breathing feels "heavy" at times since the fall daughter doesn't notice a difference with her breathing Some AM cough--no change No fever    Allergies: 1)  ! Phenergan 2)  ! Sulfa 3)  Meloxicam (Meloxicam)  Past History:  Past medical, surgical, family and social histories (including risk factors) reviewed for relevance to current acute and chronic problems.  Past Medical History: Reviewed history from 01/09/2010 and no changes required. Anxiety Depression GERD Hyperlipidemia Hypertension-- ~2003 Osteoarthritis--knees Lumbar spinal stenosis Pernicious anemia Diabetes mellitus, type II Asthma  Past Surgical History: Reviewed history from 04/01/2007 and no changes required. 1999 Ruptured disk repaired 1984 Hysterectomy Child--tonsillectomy  Family History: Reviewed history from 04/01/2007 and no changes required. Dad died of emphysema @78  Mom died of lung cancer@87  3 brothers --1 with COPD 2 sisters--1 with HTN, balance issues No CAD No other cancer  Social History: Reviewed history from 10/04/2008 and no changes required. 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         constipated from pain meds Mood has been okay---ongoing anxiety and worrying about her health Not depressed sleeps okay in general  Physical Exam  General:  alert and normal appearance.   Neck:  supple, no masses, no thyromegaly, and no cervical lymphadenopathy.   Lungs:  normal respiratory effort, no intercostal retractions, no accessory muscle use, normal breath sounds, no crackles, and no wheezes.   Heart:  normal rate, regular rhythm, no murmur, and no gallop.   Extremities:  trace non pitting edema Psych:  normally interactive, good eye contact, not anxious appearing, and not depressed appearing.     Impression & Recommendations:  Problem # 1:  ASTHMA (ICD-493.90) Assessment Unchanged  has had some "heavy" feelings since recent fall but no objective changes No contraindications to proposed surgery-- TKR Needs early mobilization and pulmonary toilet but okay to proceed will plan to follow her at rehab at [facility]  Her updated medication list for this problem includes:    Advair Diskus 100-50 Mcg/dose Aepb (Fluticasone-salmeterol) .Marland Kitchen... 1 dose inhaled two times a day. please rinse mouth after    Proair Hfa 108 (90 Base) Mcg/act Aers (Albuterol sulfate) .Marland Kitchen... 2 puffs four times daily as needed for shortness of  breath  Orders: Spirometry w/Graph (94010) Venipuncture (16109) TLB-BMP (Basic Metabolic Panel-BMET) (80048-METABOL) TLB-CBC Platelet - w/Differential (85025-CBCD) TLB-Udip w/ Micro (81001-URINE)  Problem # 2:  DIABETES MELLITUS, TYPE II (ICD-250.00) Assessment: Comment Only not on meds not an issue for perioperative period  Her updated  medication list for this problem includes:    Lisinopril-hydrochlorothiazide 20-25 Mg Tabs (Lisinopril-hydrochlorothiazide) .Marland Kitchen... 1 tab daily for high blood pressure  Labs Reviewed: Creat: 1.6 (01/09/2010)     Last Eye Exam: no retinopathy (09/20/2008) Reviewed HgBA1c results: 6.1 (12/14/2009)  6.2 (09/07/2009)  Problem # 3:  OSTEOARTHRITIS (ICD-715.90) Assessment: Deteriorated knees are worse and limiting her appropriate to proceed with surgery  Her updated medication list for this problem includes:    Vicodin 5-500 Mg Tabs (Hydrocodone-acetaminophen) .Marland Kitchen... 1/2 - 1 three times a day as needed for severe pain    Naproxen Dr 500 Mg Tbec (Naproxen) .Marland Kitchen... 1 two times a day for knee or back pain  Complete Medication List: 1)  Lisinopril-hydrochlorothiazide 20-25 Mg Tabs (Lisinopril-hydrochlorothiazide) .Marland Kitchen.. 1 tab daily for high blood pressure 2)  Simvastatin 40 Mg Tabs (Simvastatin) .... Take 1 tablet by mouth once a day 3)  Citalopram Hydrobromide 40 Mg Tabs (Citalopram hydrobromide) .Marland Kitchen.. 1 tab   daily 4)  Vicodin 5-500 Mg Tabs (Hydrocodone-acetaminophen) .... 1/2 - 1 three times a day as needed for severe pain 5)  Advair Diskus 100-50 Mcg/dose Aepb (Fluticasone-salmeterol) .Marland Kitchen.. 1 dose inhaled two times a day. please rinse mouth after 6)  Naproxen Dr 500 Mg Tbec (Naproxen) .Marland Kitchen.. 1 two times a day for knee or back pain 7)  Omeprazole 40 Mg Cpdr (Omeprazole) .... Take 1 by mouth once daily 8)  Cyanocobalamin 1000 Mcg/ml Soln (Cyanocobalamin) .... Inject 1ml monthly 9)  Multivitamins Tabs (Multiple vitamin) 10)  Proair Hfa 108 (90 Base) Mcg/act Aers (Albuterol sulfate) .... 2 puffs four times daily as needed for shortness of breath  Other Orders: Flu Vaccine 35yrs + (60454) Admin 1st Vaccine (09811) Admin 1st Vaccine [facility]) [phone])  Patient Instructions: 1)  Will plan to keep the October 5th appt but adjust as necessary after rehab stay  Current Allergies (reviewed today): !  PHENERGAN ! SULFA MELOXICAM (MELOXICAM)   Influenza Vaccine    Vaccine Type: Fluvax 3+    Site: left deltoid    Mfr: GlaxoSmithKline    Dose: 0.5 ml    Route: IM    Given by: Mervin Hack CMA (AAMA)    Exp. Date: 02/17/2011    Lot #: [id number]    VIS given: 03/14/2010  Flu Vaccine Consent Questions    Do you have a history of severe allergic reactions to this vaccine? no    Any prior history of allergic reactions to egg and/or gelatin? no    Do you have a sensitivity to the preservative Thimersol? no    Do you have a past history of Guillan-Barre Syndrome? no    Do you currently have an acute febrile illness? no    Have you ever had a severe reaction to latex? no    Vaccine information given and explained to patient? yes    Are you currently pregnant? no

## 2010-09-21 NOTE — Progress Notes (Signed)
Summary: refill request for vicodin  Phone Note Refill Request Message from:  Fax from Pharmacy  Refills Requested: Medication #1:  VICODIN 5-500 MG  TABS 1/2 - 1 three times a day as needed for severe pain   Last Refilled: 04/26/2009 [facility] [facility] your [facility].  Initial call taken by: Lowella Petties CMA,  December 05, 2009 9:11 AM  Follow-up for Phone Call        okay #60 x 0 Follow-up by: Cindee Salt MD,  December 05, 2009 10:54 AM  Additional Follow-up for Phone Call Additional follow up Details #1::        Rx faxed to pharmacy Additional Follow-up by: DeShannon Smith CMA Duncan Dull),  December 05, 2009 11:46 AM    Prescriptions: VICODIN 5-500 MG  TABS (HYDROCODONE-ACETAMINOPHEN) 1/2 - 1 three times a day as needed for severe pain  #60 x 0   Entered by:   Mervin Hack CMA (AAMA)   Authorized by:   Cindee Salt MD   Signed by:   Mervin Hack CMA (AAMA) on 12/05/2009   Method used:   Handwritten   RxID:   [id number]

## 2010-09-21 NOTE — Consult Note (Signed)
Summary: Cardiology/[facility]  Cardiology/[facility]   Imported By: Lester Greensburg 04/26/2010 12:49:53  _____________________________________________________________________  External Attachment:    Type:   Image     Comment:   External Document

## 2010-09-21 NOTE — Letter (Signed)
Summary: [facility] / BILATERAL KNEE PAIN / DR. MICHAEL MENZ  [facility] / BILATERAL KNEE PAIN / DR. MICHAEL MENZ   Imported By: Carin Primrose 04/05/2010 11:43:49  _____________________________________________________________________  External Attachment:    Type:   Image     Comment:   External Document  Appended Document: [facility] / BILATERAL KNEE PAIN / DR. MICHAEL MENZ planning TKR

## 2010-09-21 NOTE — Assessment & Plan Note (Signed)
Summary: ROA FOR 2 WEEK FOLLOW-UP/JRR   Vital Signs:  Patient profile:   77 year old female Weight:      210 pounds BMI:     36.18 O2 Sat:      97 % on Room air Temp:     98.6 degrees F oral Pulse rate:   80 / minute Pulse rhythm:   regular Resp:     16 per minute BP sitting:   128 / 60  (left arm) Cuff size:   large  Vitals Entered By: Mervin Hack CMA Duncan Dull) (Jan 09, 2010 10:25 AM)  O2 Flow:  Room air CC: 2 week follow-up   History of Present Illness: Still having difficulty with breathing Gets dyspneic even with normal routine of bathing and getting breakfast ready Ongoing back pain---hydrocodone only helps for 30 minutes feels the back pain may add to the SOB  No sig cough except a little in the AM No exertional or obvious night cough  Walks with walker and cane does remember in past using inhaler---hasn't used lately because she was out  Diagnosed with asthma in past did use advair and feels that helped   Allergies: 1)  ! Phenergan 2)  ! Sulfa 3)  Meloxicam (Meloxicam)  Past History:  Past medical, surgical, family and social histories (including risk factors) reviewed for relevance to current acute and chronic problems.  Past Medical History: Anxiety Depression GERD Hyperlipidemia Hypertension-- ~2003 Osteoarthritis--knees Lumbar spinal stenosis Pernicious anemia Diabetes mellitus, type II Asthma  Past Surgical History: Reviewed history from 04/01/2007 and no changes required. 1999 Ruptured disk repaired 1984 Hysterectomy Child--tonsillectomy  Family History: Reviewed history from 04/01/2007 and no changes required. Dad died of emphysema @78  Mom died of lung cancer@87  3 brothers --1 with COPD 2 sisters--1 with HTN, balance issues No CAD No other cancer  Social History: Reviewed history from 10/04/2008 and no changes required. 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   Neck:  supple, no masses, no thyromegaly, and no cervical lymphadenopathy.   Lungs:  normal respiratory effort, no intercostal retractions, no accessory muscle use, normal breath sounds, no crackles, and no wheezes.   Heart:  normal rate, regular rhythm, no murmur, and no gallop.   Extremities:  no sig edema Additional Exam:  Spirometry-- normal or mild obstruction   Impression & Recommendations:  Problem # 1:  DYSPNEA/SHORTNESS OF BREATH (ICD-786.09) Assessment Unchanged  continues no sig cough Also describes-- "giving out" history is unclear doesn't sound like anginal equivalent May be asthma though  Her updated medication list for this problem includes:    Triamterene-hctz 37.5-25 Mg Tabs (Triamterene-hctz) .Marland Kitchen... 1 daily    Lisinopril 20 Mg Tabs (Lisinopril) .Marland Kitchen... 1 daily    Xopenex Hfa 45 Mcg/act Aero (Levalbuterol tartrate) .Marland Kitchen... 2 puffs by mouth two times a day as needed    Advair Diskus 100-50 Mcg/dose Aepb (Fluticasone-salmeterol) .Marland Kitchen... 1 dose inhaled two times a day. please rinse mouth after  Orders: TLB-Renal Function Panel (80069-RENAL) Venipuncture (62130)  Problem # 2:  ASTHMA (ICD-493.90) Assessment: New  has past diagnosis but may still have mild persistent remembers advair helping will have trial of this again  Her updated medication  list for this problem includes:    Xopenex Hfa 45 Mcg/act Aero (Levalbuterol tartrate) .Marland Kitchen... 2 puffs by mouth two times a day as needed    Advair Diskus 100-50 Mcg/dose Aepb (Fluticasone-salmeterol) .Marland Kitchen... 1 dose inhaled two times a day. please rinse mouth after  Orders: Spirometry w/Graph (94010)  Problem # 3:  SPINAL STENOSIS, LUMBAR (ICD-724.02) Assessment: Comment  Only back pain may be contributing to dyspnea not clear no changes for now in meds  Complete Medication List: 1)  Triamterene-hctz 37.5-25 Mg Tabs (Triamterene-hctz) .Marland Kitchen.. 1 daily 2)  Lisinopril 20 Mg Tabs (Lisinopril) .Marland Kitchen.. 1 daily 3)  Simvastatin 40 Mg Tabs (Simvastatin) .... Take 1 tablet by mouth once a day 4)  Naproxen Dr 500 Mg Tbec (Naproxen) .Marland Kitchen.. 1 two times a day for knee or back pain 5)  Citalopram Hydrobromide 40 Mg Tabs (Citalopram hydrobromide) .Marland Kitchen.. 1 tab   daily 6)  Vicodin 5-500 Mg Tabs (Hydrocodone-acetaminophen) .... 1/2 - 1 three times a day as needed for severe pain 7)  Cyanocobalamin 1000 Mcg/ml Soln (Cyanocobalamin) .... Inject 1ml monthly 8)  Omeprazole 40 Mg Cpdr (Omeprazole) .... Take 1 by mouth once daily 9)  Xopenex Hfa 45 Mcg/act Aero (Levalbuterol tartrate) .... 2 puffs by mouth two times a day as needed 10)  Multivitamins Tabs (Multiple vitamin) 11)  Advair Diskus 100-50 Mcg/dose Aepb (Fluticasone-salmeterol) .Marland Kitchen.. 1 dose inhaled two times a day. please rinse mouth after  Patient Instructions: 1)  Please start the advair two times a day  2)  Please schedule a follow-up appointment in 1 month.  Prescriptions: ADVAIR DISKUS 100-50 MCG/DOSE AEPB (FLUTICASONE-SALMETEROL) 1 dose inhaled two times a day. Please rinse mouth after  #1 x 12   Entered and Authorized by:   Cindee Salt MD   Signed by:   Cindee Salt MD on 01/09/2010   Method used:   [id number]       [address]       [place], [place]  [address]       Ph: [phone] or [phone]       Fax: [phone]   RxID:   [id number] XOPENEX HFA 45 MCG/ACT AERO (LEVALBUTEROL TARTRATE) 2 puffs by mouth two times a day as needed  #1 x 12   Entered by:   Mervin Hack CMA (AAMA)   Authorized by:   Cindee Salt MD   Signed by:   Mervin Hack CMA (AAMA) on 01/09/2010   Method used:   [facility]        [id number]       [address]       [place], [place]  [address]       Ph: [phone] or [phone]       Fax: [phone]   RxID:   [id number]   Current Allergies (reviewed today): ! PHENERGAN ! SULFA MELOXICAM (MELOXICAM)

## 2010-09-21 NOTE — Progress Notes (Signed)
Summary: knee pain  Phone Note Call from Patient Call back at Home Phone [phone]   Caller: Patient Call For: Cindee Salt MD Summary of Call: Patient says that she have been having knee pain for a while and is gradually getting worse. She says that the pain has gotten so bad that she can barely walk and put pressure on her leg. She is asking if she can have a referral to an orthopedic dr. Please advise.  Initial call taken by: Melody Comas,  February 14, 2010 2:14 PM  Follow-up for Phone Call        okay to make referral for ortho eval Follow-up by: Cindee Salt MD,  February 14, 2010 8:59 PM

## 2010-09-21 NOTE — Assessment & Plan Note (Signed)
Summary: 3 month follow up/rbh   Vital Signs:  Patient profile:   77 year old female Weight:      213 pounds Temp:     98.7 degrees F oral Pulse rate:   60 / minute Pulse rhythm:   regular BP sitting:   150 / 70  (left arm) Cuff size:   large  Vitals Entered By: Mervin Hack CMA Duncan Dull) (September 07, 2009 11:45 AM) CC: 3 month follow-up   History of Present Illness: Has moved Now with villa  doing well with this----lots of demands   Cooks sometimes and over to [employer] at other times plans on getting housekeeper  Having trouble with SOB Seems to be intermittent Notes during cleaning, esp if her back starts to ache No problems at rest  No chest pain No sig edema now No dizziness or fainting spells  No cough or fever  Mood has been good Has been more upbeat, though the weather has put a damper on it  Allergies: 1)  ! Phenergan 2)  ! Sulfa 3)  Meloxicam (Meloxicam)  Past History:  Past medical, surgical, family and social histories (including risk factors) reviewed for relevance to current acute and chronic problems.  Past Medical History: Reviewed history from 10/29/2007 and no changes required. Anxiety Depression GERD Hyperlipidemia Hypertension-- ~2003 Osteoarthritis--knees Lumbar spinal stenosis Pernicious anemia Diabetes mellitus, type II  Past Surgical History: Reviewed history from 04/01/2007 and no changes required. 1999 Ruptured disk repaired 1984 Hysterectomy Child--tonsillectomy  Family History: Reviewed history from 04/01/2007 and no changes required. Dad died of emphysema @78  Mom died of lung cancer@87  3 brothers --1 with COPD 2 sisters--1 with HTN, balance issues No CAD No other cancer  Social History: Reviewed history from 10/04/2008 and no changes required. 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  NAD Neck:  supple, no masses, no thyromegaly, no carotid bruits, and no cervical lymphadenopathy.   Lungs:  normal respiratory effort and normal breath sounds.   Heart:  normal rate, regular rhythm, no murmur, and no gallop.   Abdomen:  soft and non-tender.   Extremities:  no sig edema Psych:  normally interactive, good eye contact, not anxious appearing, and not depressed appearing.     Impression & Recommendations:  Problem # 1:  HYPERTENSION (ICD-401.9) Assessment Unchanged  stable no changes  Her updated medication list for this problem includes:    Triamterene-hctz 37.5-25 Mg Tabs (Triamterene-hctz) .Marland Kitchen... 1 daily    Lisinopril 20 Mg Tabs (Lisinopril) .Marland Kitchen... 1 daily  BP today: 150/70 Prior BP: 150/80 (05/20/2009)  Labs Reviewed: K+: 4.5 (04/11/2009) Creat: : 1.4 (04/11/2009)   Chol: 204 (04/11/2009)   HDL: 41.40 (04/11/2009)   LDL: DEL (10/04/2008)   TG: 207.0 (04/11/2009)  Orders: TLB-Renal Function Panel (80069-RENAL) TLB-CBC Platelet - w/Differential (85025-CBCD) TLB-Hepatic/Liver Function Pnl (80076-HEPATIC) TLB-TSH (Thyroid Stimulating Hormone) (84443-TSH) Venipuncture (16109)  Problem # 2:  DIABETES MELLITUS, TYPE II (ICD-250.00) Assessment: Comment Only  dosn't check sugars will check labs  Her updated medication list for this problem includes:    Lisinopril 20 Mg Tabs (Lisinopril) .Marland Kitchen... 1 daily  Orders: TLB-A1C / Hgb A1C (Glycohemoglobin) (  16109-U0A)  Problem # 3:  OSTEOARTHRITIS (ICD-715.90) Assessment: Unchanged some limitations with pain and SOB probably just deconditioning tries to get by with just rest  Her updated medication list for this problem includes:    Naproxen Dr 500 Mg Tbec  (Naproxen) .Marland Kitchen... 1 two times a day for knee or back pain    Vicodin 5-500 Mg Tabs (Hydrocodone-acetaminophen) .Marland Kitchen... 1/2 - 1 three times a day as needed for severe pain  Problem # 4:  HYPERLIPIDEMIA (ICD-272.4) Assessment: Unchanged labs okay will check next time  Her updated medication list for this problem includes:    Simvastatin 40 Mg Tabs (Simvastatin) .Marland Kitchen... Take 1 tablet by mouth once a day  Labs Reviewed: SGOT: 17 (04/11/2009)   SGPT: 12 (04/11/2009)   HDL:41.40 (04/11/2009), 43.4 (10/04/2008)  LDL:DEL (10/04/2008), 80 (54/04/8118)  Chol:204 (04/11/2009), 173 (10/04/2008)  Trig:207.0 (04/11/2009), 203 (10/04/2008)  Problem # 5:  DEPRESSION (ICD-311) Assessment: Improved better since move to [place]  Her updated medication list for this problem includes:    Citalopram Hydrobromide 40 Mg Tabs (Citalopram hydrobromide) .Marland Kitchen... 1 tab   daily  Complete Medication List: 1)  Omeprazole 20 Mg Cpdr (Omeprazole) .... 2 tabs  before breakfast 2)  Triamterene-hctz 37.5-25 Mg Tabs (Triamterene-hctz) .Marland Kitchen.. 1 daily 3)  Lisinopril 20 Mg Tabs (Lisinopril) .Marland Kitchen.. 1 daily 4)  Simvastatin 40 Mg Tabs (Simvastatin) .... Take 1 tablet by mouth once a day 5)  Naproxen Dr 500 Mg Tbec (Naproxen) .Marland Kitchen.. 1 two times a day for knee or back pain 6)  Multivitamins Tabs (Multiple vitamin) 7)  Citalopram Hydrobromide 40 Mg Tabs (Citalopram hydrobromide) .Marland Kitchen.. 1 tab   daily 8)  Vicodin 5-500 Mg Tabs (Hydrocodone-acetaminophen) .... 1/2 - 1 three times a day as needed for severe pain 9)  Cyanocobalamin 1000 Mcg/ml Soln (Cyanocobalamin) .... Inject 1ml monthly  Patient Instructions: 1)  Please schedule a follow-up appointment in 3 months .   Current Allergies (reviewed today): ! PHENERGAN ! SULFA MELOXICAM (MELOXICAM)

## 2010-09-21 NOTE — Letter (Signed)
Summary: Medical Clearance Request from Dr.Michael [facility] Clearance Request from Dr[facility]Michael [facility]   Imported By: Beau Fanny 04/19/2010 13:29:59  _____________________________________________________________________  External Attachment:    Type:   Image     Comment:   External Document

## 2010-09-28 ENCOUNTER — Telehealth: Payer: Self-pay | Admitting: Internal Medicine

## 2010-10-05 NOTE — Progress Notes (Signed)
Summary: pt requests nystatin  Phone Note Call from Patient Call back at Home Phone [phone]   Caller: Patient Summary of Call: Pt is asking for nystatin to [facility].  She states she has a yeasty rash under her breasts.  She had this when she was in rehab and was given nystatin, which helped. Initial call taken by: Lowella Petties CMA, AAMA,  September 28, 2010 1:05 PM  Follow-up for Phone Call        okay to send Rx ---find out if she prefers cream or powder and okay 1 container of either with 1 refill  apply three times a day as needed  Follow-up by: Cindee Salt MD,  September 28, 2010 1:07 PM  Additional Follow-up for Phone Call Additional follow up Details #1::        Rx Called In, left message on machine that rx ready for pick-up  Additional Follow-up by: Mervin Hack CMA Duncan Dull),  September 28, 2010 5:52 PM    New/Updated Medications: NYSTATIN 100000 UNIT/GM CREA (NYSTATIN) apply three times a day as needed Prescriptions: NYSTATIN 100000 UNIT/GM CREA (NYSTATIN) apply three times a day as needed  #103mth supply x 1   Entered by:   Mervin Hack CMA (AAMA)   Authorized by:   Cindee Salt MD   Signed by:   Mervin Hack CMA (AAMA) on 09/28/2010   Method used:   [facility]        [id number]       [address]       [place], [place]  [address]       Ph: [phone] or [phone]       Fax: [phone]   RxID:   [id number]

## 2010-10-16 ENCOUNTER — Telehealth: Payer: Self-pay | Admitting: Internal Medicine

## 2010-10-26 NOTE — Progress Notes (Signed)
Summary: NAPROXEN  Phone Note [facility]ill Request [id number] on October 16, 2010 12:14 PM  E-Scribe Request for NAPROXEN 500 TAKE 1 TWICE DAILY FOR KNEE AND BACK PAIN ,  last refilled 08/16/2010  not on current med list, ok to fill?   Method Requested: Electronic Initial call taken by: Mervin Hack CMA Duncan Dull),  October 16, 2010 12:36 PM  Follow-up for Phone Call        okay to refill #60 x 0  she is already on omeprazole Follow-up by: Cindee Salt MD,  October 16, 2010 1:20 PM  Additional Follow-up for Phone Call Additional follow up Details #1::        Rx faxed to pharmacy Additional Follow-up by: DeShannon Smith CMA Duncan Dull),  October 16, 2010 2:28 PM    New/Updated Medications: NAPROXEN 500 MG TABS (NAPROXEN) take 1 by mouth two times a day as needed for pain Prescriptions: NAPROXEN 500 MG TABS (NAPROXEN) take 1 by mouth two times a day as needed for pain  #60 x 0   Entered by:   Mervin Hack CMA (AAMA)   Authorized by:   Cindee Salt MD   Signed by:   Mervin Hack CMA (AAMA) on 10/16/2010   Method used:   [facility]        [id number]       [address]       [place], [place]  [address]       Ph: [phone] or [phone]       Fax: [phone]   RxID:   [id number]

## 2010-11-03 ENCOUNTER — Ambulatory Visit (INDEPENDENT_AMBULATORY_CARE_PROVIDER_SITE_OTHER): Payer: Medicare Other | Admitting: Internal Medicine

## 2010-11-03 ENCOUNTER — Other Ambulatory Visit: Payer: Self-pay | Admitting: Internal Medicine

## 2010-11-03 ENCOUNTER — Encounter: Payer: Self-pay | Admitting: Internal Medicine

## 2010-11-03 DIAGNOSIS — I10 Essential (primary) hypertension: Secondary | ICD-10-CM

## 2010-11-03 DIAGNOSIS — E119 Type 2 diabetes mellitus without complications: Secondary | ICD-10-CM

## 2010-11-03 DIAGNOSIS — R42 Dizziness and giddiness: Secondary | ICD-10-CM

## 2010-11-03 DIAGNOSIS — M48061 Spinal stenosis, lumbar region without neurogenic claudication: Secondary | ICD-10-CM

## 2010-11-03 LAB — CBC WITH DIFFERENTIAL/PLATELET
Basophils Relative: 1.6 % (ref 0.0–3.0)
Eosinophils Absolute: 0.7 10*3/uL (ref 0.0–0.7)
MCHC: 34.3 g/dL (ref 30.0–36.0)
MCV: 87.3 fl (ref 78.0–100.0)
Monocytes Absolute: 0.8 10*3/uL (ref 0.1–1.0)
Neutrophils Relative %: 47.1 % (ref 43.0–77.0)
Platelets: 184 10*3/uL (ref 150.0–400.0)
RBC: 3.6 Mil/uL — ABNORMAL LOW (ref 3.87–5.11)

## 2010-11-03 LAB — RENAL FUNCTION PANEL
Albumin: 4.2 g/dL (ref 3.5–5.2)
Calcium: 9.2 mg/dL (ref 8.4–10.5)
Creatinine, Ser: 1.4 mg/dL — ABNORMAL HIGH (ref 0.4–1.2)
Glucose, Bld: 96 mg/dL (ref 70–99)

## 2010-11-03 LAB — TSH: TSH: 1.05 u[IU]/mL (ref 0.35–5.50)

## 2010-11-03 LAB — HEPATIC FUNCTION PANEL
Bilirubin, Direct: 0.1 mg/dL (ref 0.0–0.3)
Total Protein: 6.4 g/dL (ref 6.0–8.3)

## 2010-11-07 NOTE — Assessment & Plan Note (Signed)
Summary: 4 MTH F/L DLO   Vital Signs:  Patient profile:   77 year old female Weight:      202 pounds BMI:     35.91 Temp:     98.7 degrees F oral Pulse rate:   70 / minute Pulse rhythm:   regular BP sitting:   159 / 77  (left arm) Cuff size:   large  Vitals Entered By: Mervin Hack CMA Duncan Dull) (November 03, 2010 11:08 AM) CC: follow-up   History of Present Illness: "I feel like my body has gone backwards" No energy Knee feels fine but she "gives out" walking or "trying to do anything" Does okay in AM but wears down later on  Still gets dizzy feelings Also gets sensations in her head Has motion sensation if her head is in the wrong direction and sometimes when she moves quickly when up This scares her so she has been limiting her activity uses meclizine as needed  No falls since last visit  Has episodic depression usually goes away  by the end of day Occ trouble initiating sleep  No headaches no chest pain No SOB gets edema in evening--better with elevation  hasn't been checking sugars  Allergies: 1)  ! Phenergan 2)  ! Sulfa 3)  Meloxicam (Meloxicam)  Past History:  Past medical, surgical, family and social histories (including risk factors) reviewed for relevance to current acute and chronic problems.  Past Medical History: Reviewed history from 01/09/2010 and no changes required. Anxiety Depression GERD Hyperlipidemia Hypertension-- ~2003 Osteoarthritis--knees Lumbar spinal stenosis Pernicious anemia Diabetes mellitus, type II Asthma  Past Surgical History: Reviewed history from 07/07/2010 and no changes required. 1999 Ruptured disk repaired 1984 Hysterectomy Child--tonsillectomy 9/11 Right TKR   Dr Rosita Kea  Family History: Reviewed history from 04/01/2007 and no changes required. Dad died of emphysema @78  Mom died of lung cancer@87  3 brothers --1 with COPD 2 sisters--1 with HTN, balance issues No CAD No other cancer  Social  History: Reviewed history from 10/04/2008 and no changes required. 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   Eyes:  pupils equal, pupils round, and no nystagmus.   Neck:  supple, no masses, no thyromegaly, and no cervical lymphadenopathy.   Lungs:  normal respiratory effort, no intercostal retractions, no accessory muscle use, and normal breath sounds.   Heart:  normal rate, regular rhythm, no murmur, and no gallop.   Pulses:  faint in feet Extremities:  no edema Skin:  no suspicious lesions and no ulcerations.   Psych:  normally interactive, good eye contact, not anxious appearing, and not depressed appearing.    Diabetes Management Exam:    Foot Exam (with socks and/or shoes not present):       Sensory-Pinprick/Light touch:          Left medial foot (L-4): diminished          Left dorsal foot (L-5): diminished          Left lateral foot (S-1): diminished          Right medial foot (L-4): diminished          Right dorsal foot (L-5): diminished  Right lateral foot (S-1): diminished       Inspection:          Left foot: normal          Right foot: normal       Nails:          Left foot: thickened          Right foot: thickened    Eye Exam:       Eye Exam done elsewhere          Date: 02/17/2010          Results: no retinopathy          Done by: Dr Alexia Freestone   Impression & Recommendations:  Problem # 1:  HYPERTENSION (ICD-401.9) Assessment Unchanged  reasonable control due for labs  Her updated medication list for this problem includes:    Lisinopril-hydrochlorothiazide 20-25 Mg Tabs (Lisinopril-hydrochlorothiazide) .Marland Kitchen... 1 tab daily for high  blood pressure  BP today: 159/77 Prior BP: 138/70 (07/07/2010)  Labs Reviewed: K+: 4.7 (04/19/2010) Creat: : 1.5 (04/19/2010)   Chol: 204 (04/11/2009)   HDL: 41.40 (04/11/2009)   LDL: DEL (10/04/2008)   TG: 207.0 (04/11/2009)  Orders: TLB-Renal Function Panel (80069-RENAL) TLB-CBC Platelet - w/Differential (85025-CBCD) TLB-Hepatic/Liver Function Pnl (80076-HEPATIC) TLB-TSH (Thyroid Stimulating Hormone) (84443-TSH) Venipuncture (16109)  Problem # 2:  SPINAL STENOSIS, LUMBAR (ICD-724.02) Assessment: Unchanged knee better but not active enough signed form for wellness program needs to exercise more uses naproxen and occ hydrocodone  Problem # 3:  DIABETES MELLITUS, TYPE II (ICD-250.00) Assessment: Unchanged  has been fine without meds  Her updated medication list for this problem includes:    Lisinopril-hydrochlorothiazide 20-25 Mg Tabs (Lisinopril-hydrochlorothiazide) .Marland Kitchen... 1 tab daily for high blood pressure  Labs Reviewed: Creat: 1.5 (04/19/2010)     Last Eye Exam: no retinopathy (02/17/2010) Reviewed HgBA1c results: 6.1 (12/14/2009)  6.2 (09/07/2009)  Orders: TLB-A1C / Hgb A1C (Glycohemoglobin) (83036-A1C)  Problem # 4:  DEPRESSION (ICD-311) Assessment: Unchanged mostly controlled continue meds  Her updated medication list for this problem includes:    Citalopram Hydrobromide 40 Mg Tabs (Citalopram hydrobromide) .Marland Kitchen... 1 tab   daily  Problem # 5:  VERTIGO (ICD-780.4) Assessment: Unchanged ongoing if limiting her, needs meclizine regularly  Complete Medication List: 1)  Lisinopril-hydrochlorothiazide 20-25 Mg Tabs (Lisinopril-hydrochlorothiazide) .Marland Kitchen.. 1 tab daily for high blood pressure 2)  Citalopram Hydrobromide 40 Mg Tabs (Citalopram hydrobromide) .Marland Kitchen.. 1 tab   daily 3)  Vicodin 5-500 Mg Tabs (Hydrocodone-acetaminophen) .... 1/2 - 1 three times a day as needed for severe pain 4)  Advair Diskus 100-50 Mcg/dose Aepb (Fluticasone-salmeterol) .Marland Kitchen.. 1 dose inhaled  two times a day. please rinse mouth after 5)  Omeprazole 40 Mg Cpdr (Omeprazole) .... Take 1 by mouth once daily 6)  Proair Hfa 108 (90 Base) Mcg/act Aers (Albuterol sulfate) .... 2 puffs four times daily as needed for shortness of breath 7)  Nystatin 100000 Unit/gm Crea (Nystatin) .... Apply three times a day as needed 8)  Naproxen 500 Mg Tabs (Naproxen) .... Take 1 by mouth two times a day as needed for pain 9)  Multivitamins Tabs (Multiple vitamin) 10)  Cyanocobalamin 1000 Mcg/ml Soln (Cyanocobalamin) .... Inject 1ml monthly  Patient Instructions: 1)  If your activity is limited because of vertigo, please take the meclizine two times a day every day to prevent the problems 2)  Please start with the wellness program 3)  Please schedule a follow-up appointment in 4 months .    Orders Added: 1)  TLB-A1C / Hgb A1C (Glycohemoglobin) [83036-A1C] 2)  TLB-Renal Function Panel [80069-RENAL] 3)  TLB-CBC Platelet - w/Differential [85025-CBCD] 4)  TLB-Hepatic/Liver Function Pnl [80076-HEPATIC] 5)  TLB-TSH (Thyroid Stimulating Hormone) [84443-TSH] 6)  Venipuncture [91478] 7)  Est. Patient Level IV [29562]    Current Allergies (reviewed today): ! PHENERGAN ! SULFA MELOXICAM (MELOXICAM)

## 2010-11-17 ENCOUNTER — Other Ambulatory Visit: Payer: Self-pay | Admitting: Internal Medicine

## 2010-12-19 ENCOUNTER — Other Ambulatory Visit: Payer: Self-pay | Admitting: *Deleted

## 2010-12-19 MED ORDER — OMEPRAZOLE 20 MG PO CPDR: 20.0000 mg | DELAYED_RELEASE_CAPSULE | Freq: Two times a day (BID) | ORAL | Status: DC

## 2010-12-21 ENCOUNTER — Other Ambulatory Visit: Payer: Self-pay | Admitting: Internal Medicine

## 2011-01-31 ENCOUNTER — Other Ambulatory Visit: Payer: Self-pay | Admitting: *Deleted

## 2011-01-31 MED ORDER — CYANOCOBALAMIN 1000 MCG/ML IJ SOLN: 1000.0000 ug | INTRAMUSCULAR | Status: DC

## 2011-02-09 ENCOUNTER — Encounter: Payer: Self-pay | Admitting: Cardiovascular Disease

## 2011-03-02 ENCOUNTER — Other Ambulatory Visit: Payer: Self-pay | Admitting: *Deleted

## 2011-03-02 ENCOUNTER — Encounter: Payer: Self-pay | Admitting: Internal Medicine

## 2011-03-02 MED ORDER — LISINOPRIL-HYDROCHLOROTHIAZIDE 20-25 MG PO TABS: 1.0000 | ORAL_TABLET | Freq: Every day | ORAL | Status: DC

## 2011-03-05 ENCOUNTER — Encounter: Payer: Self-pay | Admitting: Internal Medicine

## 2011-03-05 ENCOUNTER — Ambulatory Visit (INDEPENDENT_AMBULATORY_CARE_PROVIDER_SITE_OTHER): Payer: Medicare Other | Admitting: Internal Medicine

## 2011-03-05 VITALS — BP 148/80 | HR 76 | Temp 97.9°F | Ht 63.0 in | Wt 206.0 lb

## 2011-03-05 DIAGNOSIS — E119 Type 2 diabetes mellitus without complications: Secondary | ICD-10-CM

## 2011-03-05 DIAGNOSIS — R5381 Other malaise: Secondary | ICD-10-CM

## 2011-03-05 DIAGNOSIS — M1990 Unspecified osteoarthritis, unspecified site: Secondary | ICD-10-CM

## 2011-03-05 DIAGNOSIS — R5383 Other fatigue: Secondary | ICD-10-CM

## 2011-03-05 DIAGNOSIS — F329 Major depressive disorder, single episode, unspecified: Secondary | ICD-10-CM

## 2011-03-05 LAB — CBC WITH DIFFERENTIAL/PLATELET
Basophils Relative: 2.6 % (ref 0.0–3.0)
Hemoglobin: 10.4 g/dL — ABNORMAL LOW (ref 12.0–15.0)
Lymphocytes Relative: 28.6 % (ref 12.0–46.0)
Monocytes Relative: 10.5 % (ref 3.0–12.0)
Neutro Abs: 4.4 10*3/uL (ref 1.4–7.7)
RBC: 3.46 Mil/uL — ABNORMAL LOW (ref 3.87–5.11)

## 2011-03-05 LAB — BASIC METABOLIC PANEL
CO2: 27 mEq/L (ref 19–32)
Calcium: 8.8 mg/dL (ref 8.4–10.5)
GFR: 38.18 mL/min — ABNORMAL LOW (ref >60.00)
Sodium: 138 mEq/L (ref 135–145)

## 2011-03-05 LAB — HEMOGLOBIN A1C: Hgb A1c MFr Bld: 6.3 % (ref 4.6–6.5)

## 2011-03-05 LAB — HEPATIC FUNCTION PANEL
ALT: 11 U/L (ref 0–35)
Alkaline Phosphatase: 46 U/L (ref 39–117)
Bilirubin, Direct: 0 mg/dL (ref 0.0–0.3)
Total Protein: 6.9 g/dL (ref 6.0–8.3)

## 2011-03-05 NOTE — Assessment & Plan Note (Signed)
Lab Results  Component Value Date   HGBA1C 6.0 11/03/2010   Will check level again since she doesn't check at home

## 2011-03-05 NOTE — Assessment & Plan Note (Signed)
Will consider injection in left knee

## 2011-03-05 NOTE — Progress Notes (Signed)
Subjective:    Patient ID: Bailey Ramirez, female    DOB: 16-Mar-1929, 77 y.o.   MRN: [id number]  HPI "I just don't have any energy---I don't feel good" Tires out just walking flat for a short distance  Having trouble with her other knee TKR on right was about 10 months ago  No chest pain Has "strange feelings in head... So full" occ gets vertigo as well--this is different thought No palpitations  Still with some edema by evening--esp in hot weather. Better in AM  Hasn't been involved in fitness program  Does feel depressed--though not daily No real thoughts of death or suicide Frustrated by limitations  Still doesn't check sugars  Current Outpatient Prescriptions on File Prior to Visit  Medication Sig Dispense Refill  . albuterol (PROAIR HFA) 108 (90 BASE) MCG/ACT inhaler Inhale 2 puffs into the lungs 4 (four) times daily as needed.        . citalopram (CELEXA) 40 MG tablet Take 40 mg by mouth daily.        . cyanocobalamin (,VITAMIN B-12,) 1000 MCG/ML injection Inject 1 mL (1,000 mcg total) into the muscle every 30 (thirty) days.  1 mL  1  . Fluticasone-Salmeterol (ADVAIR DISKUS) 100-50 MCG/DOSE AEPB Inhale 1 puff into the lungs 2 (two) times daily. Please rinse mouth after       . HYDROcodone-acetaminophen (VICODIN) 5-500 MG per tablet Take 1 tablet by mouth 3 (three) times daily as needed. 1/2-1 tab       . lisinopril-hydrochlorothiazide (PRINZIDE,ZESTORETIC) 20-25 MG per tablet Take 1 tablet by mouth daily.  30 tablet  0  . Multiple Vitamins-Minerals (MULTIVITAL) tablet Take 1 tablet by mouth daily.        . naproxen (NAPROSYN) 500 MG tablet TAKE 1 TABLET BY MOUTH TWICE A DAY FOR KNEE OR BACK PAIN  60 tablet  9  . nystatin (MYCOSTATIN) cream Apply topically 3 (three) times daily as needed.          Allergies  Allergen Reactions  . Meloxicam     REACTION: leg swellling  . Promethazine Hcl     REACTION: BONKERS PER PATIENT  . Sulfonamide Derivatives     REACTION: rash     Past Medical History  Diagnosis Date  . Anxiety   . Depression   . GERD (gastroesophageal reflux disease)   . Hyperlipemia   . HTN (hypertension) 2003  . Osteoarthritis     knees  . Lumbar spinal stenosis   . Pernicious anemia   . Diabetes mellitus, type 2   . Asthma     Past Surgical History  Procedure Date  . Other surgical history 1999    Ruptured disc repair  . Abdominal hysterectomy 1984  . Tonsillectomy as a child  . Total knee arthroplasty 9/11    Dr Rosita Kea    Family History  Problem Relation Age of Onset  . Lung cancer Mother   . Hypertension Sister   . Emphysema Father   . COPD Brother   . Other Sister     balance issues  . Coronary artery disease Neg Hx   . Cancer Neg Hx     no other cancer    History   Social History  . Marital Status: Widowed    Spouse Name: N/A    Number of Children: 2  . Years of Education: N/A   Occupational History  . Retired Print production planner    Social History Main Topics  . Smoking status: Former Smoker  Quit date: 08/20/1986  . Smokeless tobacco: Not on file   Comment: Quit 1988  . Alcohol Use: No  . Drug Use: Not on file  . Sexually Active: Not on file   Other Topics Concern  . 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 Health care POA is son, then daughter. Never considered DNR or feeding tub-discussed.    Review of Systems Appetite is fair Weight is up a few pounds from last time Sleeps okay    Objective:   Physical Exam  Constitutional: She appears well-developed and well-nourished. No distress.  Neck: Normal range of motion. Neck supple. No thyromegaly present.  Cardiovascular: Normal rate, regular rhythm and normal heart sounds.  Exam reveals no gallop.   No murmur heard. Pulmonary/Chest: Effort normal and breath sounds normal. No respiratory distress. She has no wheezes. She has no rales.  Abdominal: Soft.  There is no tenderness.  Musculoskeletal: Normal range of motion. She exhibits no tenderness.       Left knee is thickened but no apparent effusion  Lymphadenopathy:    She has no cervical adenopathy.  Psychiatric: Her behavior is normal. Judgment and thought content normal.       Seems dysphoric          Assessment & Plan:

## 2011-03-05 NOTE — Patient Instructions (Signed)
Please contact Casimiro Needle and start in the fitness program at [facility] Call for appointment if you want me to inject your left knee

## 2011-03-05 NOTE — Assessment & Plan Note (Signed)
Mood is down but not clear this is medication sensitive Discussed getting into exercise regimen Will continue the citalopram

## 2011-03-05 NOTE — Assessment & Plan Note (Signed)
Not clearly DOE Seems to be depressed but not every day Frustrated by limitations Discussed starting with fitness program

## 2011-03-26 ENCOUNTER — Other Ambulatory Visit: Payer: Self-pay | Admitting: Internal Medicine

## 2011-04-16 ENCOUNTER — Encounter: Payer: Self-pay | Admitting: Internal Medicine

## 2011-04-16 ENCOUNTER — Ambulatory Visit (INDEPENDENT_AMBULATORY_CARE_PROVIDER_SITE_OTHER): Payer: Medicare Other | Admitting: Internal Medicine

## 2011-04-16 VITALS — BP 163/92 | HR 77 | Ht 63.0 in | Wt 203.0 lb

## 2011-04-16 DIAGNOSIS — F329 Major depressive disorder, single episode, unspecified: Secondary | ICD-10-CM

## 2011-04-16 DIAGNOSIS — M1710 Unilateral primary osteoarthritis, unspecified knee: Secondary | ICD-10-CM | POA: Insufficient documentation

## 2011-04-16 DIAGNOSIS — M1990 Unspecified osteoarthritis, unspecified site: Secondary | ICD-10-CM

## 2011-04-16 DIAGNOSIS — R5381 Other malaise: Secondary | ICD-10-CM

## 2011-04-16 DIAGNOSIS — R5383 Other fatigue: Secondary | ICD-10-CM

## 2011-04-16 MED ORDER — METHYLPHENIDATE HCL 5 MG PO TABS: 5.0000 mg | ORAL_TABLET | Freq: Two times a day (BID) | ORAL | Status: DC

## 2011-04-16 MED ORDER — TRIAMCINOLONE ACETONIDE 40 MG/ML IJ SUSP: 40.0000 mg | Freq: Once | INTRAMUSCULAR | Status: DC

## 2011-04-16 NOTE — Assessment & Plan Note (Signed)
Clearly worse Psychomotor retardation, social withdrawal, poor energy levels Will add ritalin in low dose and see back Advised to get back to the fitness center

## 2011-04-16 NOTE — Assessment & Plan Note (Signed)
Procedure  Sterile prep to medial left knee 1.5cc 2% plain lido local 6cc 2% lidocaine and 40mg  kenalog injected into knee without difficulty Tolerated well Discussed home care

## 2011-04-16 NOTE — Progress Notes (Signed)
  Subjective:    Patient ID: Bailey Ramirez, female    DOB: 23-Nov-1928, 77 y.o.   MRN: [id number]  HPI Still having trouble with energy levels Hasn't been going to fitness program----using bicycle pedal exercise in unit  Sleeps okay after initial trouble initiating at times Some trouble getting up in AM--will lie around in bed  Feels her depression is worse Feels down on a daily basis All 5 siblings have sig health issues--feels guilty she can't help them No regular crying Does have anhedonia  No chest pain No SOB  Ongoing left knee pain Also has knee pain that is limiting her activity   Review of Systems Appetite is okay Weight is down 3# since last time    Objective:   Physical Exam  Constitutional: She appears well-developed and well-nourished. No distress.  Neck: Normal range of motion. Neck supple.  Cardiovascular: Normal rate, regular rhythm and normal heart sounds.  Exam reveals no gallop.   No murmur heard. Pulmonary/Chest: Effort normal and breath sounds normal. No respiratory distress. She has no wheezes. She has no rales.  Musculoskeletal: She exhibits no edema.       Thickening in both knees Left knee is stiff without clear effusion  Lymphadenopathy:    She has no cervical adenopathy.  Psychiatric:       Depressed mood Some psychomotor retardation No thought process disturbance          Assessment & Plan:

## 2011-04-16 NOTE — Assessment & Plan Note (Signed)
Ongoing knee and back pain May be adding to depression Will inject left knee

## 2011-04-16 NOTE — Patient Instructions (Signed)
Start the methyphenidate with 1/2 tab at breakfast and lunch. If no side effects after 2-3 days, go up to a full tablet twice a day

## 2011-04-16 NOTE — Assessment & Plan Note (Signed)
Ongoing No clear physical cause Chronic stable anemia Good diabetic control Will try the ritalin

## 2011-04-17 ENCOUNTER — Telehealth: Payer: Self-pay | Admitting: *Deleted

## 2011-04-17 NOTE — Telephone Encounter (Signed)
Yes, she is supposed to take both.

## 2011-04-17 NOTE — Telephone Encounter (Signed)
Patient advised as instructed via telephone. 

## 2011-04-17 NOTE — Telephone Encounter (Signed)
.  left message to have patient return my call.  

## 2011-04-17 NOTE — Telephone Encounter (Signed)
Pt was started on ritalin yesterday and just wants to make sure it's ok for her to continue to take her celexa.  Please advise.

## 2011-04-23 ENCOUNTER — Other Ambulatory Visit: Payer: Self-pay | Admitting: Internal Medicine

## 2011-04-24 NOTE — Telephone Encounter (Signed)
Rx sent electronically.  

## 2011-04-26 ENCOUNTER — Other Ambulatory Visit: Payer: Self-pay | Admitting: *Deleted

## 2011-04-26 MED ORDER — HYDROCODONE-ACETAMINOPHEN 5-500 MG PO TABS: 0.5000 | ORAL_TABLET | Freq: Three times a day (TID) | ORAL | Status: DC | PRN

## 2011-04-26 NOTE — Telephone Encounter (Signed)
Form on your desk  

## 2011-04-26 NOTE — Telephone Encounter (Signed)
Okay #90 x 0 

## 2011-05-18 ENCOUNTER — Encounter: Payer: Self-pay | Admitting: Internal Medicine

## 2011-05-18 ENCOUNTER — Ambulatory Visit (INDEPENDENT_AMBULATORY_CARE_PROVIDER_SITE_OTHER): Payer: Medicare Other | Admitting: Internal Medicine

## 2011-05-18 VITALS — BP 161/76 | HR 72 | Temp 98.3°F | Ht 63.0 in | Wt 202.0 lb

## 2011-05-18 DIAGNOSIS — Z23 Encounter for immunization: Secondary | ICD-10-CM

## 2011-05-18 DIAGNOSIS — R300 Dysuria: Secondary | ICD-10-CM | POA: Insufficient documentation

## 2011-05-18 DIAGNOSIS — F329 Major depressive disorder, single episode, unspecified: Secondary | ICD-10-CM

## 2011-05-18 MED ORDER — METHYLPHENIDATE HCL 5 MG PO TABS: 5.0000 mg | ORAL_TABLET | Freq: Two times a day (BID) | ORAL | Status: DC

## 2011-05-18 MED ORDER — ESTROGENS, CONJUGATED 0.625 MG/GM VA CREA: TOPICAL_CREAM | VAGINAL | Status: DC

## 2011-05-18 NOTE — Patient Instructions (Signed)
Please take the methylphenidate only at breakfast Try the estrogen around your vaginal area daily for the first week, then 3 times weekly Please call if by next week if you continue to have the burning when you urinate

## 2011-05-18 NOTE — Progress Notes (Signed)
Subjective:    Patient ID: Bailey Ramirez, female    DOB: 1929-04-24, 77 y.o.   MRN: [id number]  HPI Has been taking the methylphenidate "it does the trick" Has made her more outgoing Back to water aerobics twice a week Rides bicycle also---backing off some due to overworking her bad leg Casimiro Needle advised this)  Notes that the lunchtime dose is making her tired and she goes to sleep---then drags after  Had been sleeping well at night till sleeping more in afternoon  No crying Not feeling sad  Has burning dysuria Redness and raw in perineum Using OTC cream  Nocturia x 2 Trying to hold in daytime due to the pain  Current Outpatient Prescriptions on File Prior to Visit  Medication Sig Dispense Refill  . albuterol (PROAIR HFA) 108 (90 BASE) MCG/ACT inhaler Inhale 2 puffs into the lungs 4 (four) times daily as needed.        . citalopram (CELEXA) 40 MG tablet TAKE 1 TABLET BY MOUTH EVERY DAY  30 tablet  11  . cyanocobalamin (,VITAMIN B-12,) 1000 MCG/ML injection INJECT 1 ML INTRAMUSCARLY EVERY 30 DAYS  1 mL  6  . Fluticasone-Salmeterol (ADVAIR DISKUS) 100-50 MCG/DOSE AEPB Inhale 1 puff into the lungs 2 (two) times daily. Please rinse mouth after       . HYDROcodone-acetaminophen (VICODIN) 5-500 MG per tablet Take 0.5-1 tablets by mouth 3 (three) times daily as needed for pain.  90 tablet  0  . lisinopril-hydrochlorothiazide (PRINZIDE,ZESTORETIC) 20-25 MG per tablet TAKE 1 TABLET BY MOUTH ONCE A DAY  30 tablet  6  . Multiple Vitamins-Minerals (MULTIVITAL) tablet Take 1 tablet by mouth daily.        . naproxen (NAPROSYN) 500 MG tablet TAKE 1 TABLET BY MOUTH TWICE A DAY FOR KNEE OR BACK PAIN  60 tablet  9  . nystatin (MYCOSTATIN) cream Apply topically 3 (three) times daily as needed.        Marland Kitchen omeprazole (PRILOSEC) 40 MG capsule Take 40 mg by mouth daily.         Current Facility-Administered Medications on File Prior to Visit  Medication Dose Route Frequency Provider Last Rate Last Dose  .  triamcinolone acetonide (KENALOG-40) injection 40 mg  40 mg Intra-articular Once Varney Baas, MD        Allergies  Allergen Reactions  . Meloxicam     REACTION: leg swellling  . Promethazine Hcl     REACTION: BONKERS PER PATIENT  . Sulfonamide Derivatives     REACTION: rash    Past Medical History  Diagnosis Date  . Anxiety   . Depression   . GERD (gastroesophageal reflux disease)   . Hyperlipemia   . HTN (hypertension) 2003  . Osteoarthritis     knees  . Lumbar spinal stenosis   . Pernicious anemia   . Diabetes mellitus, type 2   . Asthma     Past Surgical History  Procedure Date  . Other surgical history 1999    Ruptured disc repair  . Abdominal hysterectomy 1984  . Tonsillectomy as a child  . Total knee arthroplasty 9/11    Dr Rosita Kea    Family History  Problem Relation Age of Onset  . Lung cancer Mother   . Hypertension Sister   . Emphysema Father   . COPD Brother   . Other Sister     balance issues  . Coronary artery disease Neg Hx   . Cancer Neg Hx     no  other cancer    History   Social History  . Marital Status: Widowed    Spouse Name: N/A    Number of Children: 2  . Years of Education: N/A   Occupational History  . Retired Print production planner    Social History Main Topics  . Smoking status: Former Smoker    Quit date: 08/20/1986  . Smokeless tobacco: Never Used   Comment: Quit 1988  . Alcohol Use: No  . Drug Use: Not on file  . Sexually Active: Not on file   Other Topics Concern  . 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 Health care POA is son, then daughter. Never considered DNR or feeding tub-discussed.    Review of Systems Appetite is good Weight is stable    Objective:   Physical Exam  Constitutional: She appears well-developed and well-nourished. No distress.  Abdominal: Soft. There is no tenderness.  Genitourinary:       Redness  and atrophy of vulvar tissues No induration or mass  Musculoskeletal:       No CVA tenderness  Psychiatric: She has a normal mood and affect. Her behavior is normal. Judgment and thought content normal.          Assessment & Plan:

## 2011-05-18 NOTE — Assessment & Plan Note (Signed)
Likely from atrophic vaginits Can't exclude cystitis as well---couldn't give specimen but symptoms are vague Will try estrogen cream Antibiotic if dysuria persists

## 2011-05-18 NOTE — Assessment & Plan Note (Signed)
Better Has more energy and is involved more in exercise, etc Lunchtime dose seems to cause sedation---will skip this one now

## 2011-05-21 ENCOUNTER — Other Ambulatory Visit: Payer: Self-pay | Admitting: Internal Medicine

## 2011-05-28 ENCOUNTER — Telehealth: Payer: Self-pay | Admitting: *Deleted

## 2011-05-28 MED ORDER — HYDROCORTISONE 2.5 % EX CREA: 1.0000 "application " | TOPICAL_CREAM | Freq: Three times a day (TID) | CUTANEOUS | Status: DC | PRN

## 2011-05-28 NOTE — Telephone Encounter (Signed)
Pt called to report that she still has the burning in vaginal area.  This is some better but not much.  She was told to call today with report.

## 2011-05-28 NOTE — Telephone Encounter (Signed)
Please have her continue the estrogen cream---but this can take a while to really help She can also use 2.5% hydrocortisone cream on the days she doesn't use the estrogen cream---up to three times a day. This should reduce the irritation  #60 gm x 1

## 2011-05-28 NOTE — Telephone Encounter (Signed)
Spoke with patient and advised results rx sent to pharmacy by e-script  

## 2011-07-09 ENCOUNTER — Other Ambulatory Visit: Payer: Self-pay | Admitting: *Deleted

## 2011-07-09 MED ORDER — METHYLPHENIDATE HCL 5 MG PO TABS: 5.0000 mg | ORAL_TABLET | Freq: Every day | ORAL | Status: DC

## 2011-07-09 NOTE — Telephone Encounter (Signed)
Left message on machine that rx is ready for pick-up, and it will be at our front desk.  

## 2011-07-09 NOTE — Telephone Encounter (Signed)
Please call pt when ready, she has appt to see you next week, but is out of medicine.

## 2011-07-11 ENCOUNTER — Encounter: Payer: Self-pay | Admitting: Family Medicine

## 2011-07-11 ENCOUNTER — Ambulatory Visit (INDEPENDENT_AMBULATORY_CARE_PROVIDER_SITE_OTHER): Payer: Medicare Other | Admitting: Family Medicine

## 2011-07-11 VITALS — BP 177/87 | HR 77 | Ht 63.0 in

## 2011-07-11 DIAGNOSIS — R55 Syncope and collapse: Secondary | ICD-10-CM | POA: Insufficient documentation

## 2011-07-11 NOTE — Progress Notes (Signed)
Subjective:    Patient ID: Bailey Ramirez, female    DOB: 1928-10-16, 77 y.o.   MRN: [id number]  HPI 77 yo pt of Dr. Alphonsus Sias [place].  Discussed with Angelica Chessman, RN, pt and her daughter. Has been having issues with constipation last several days. Took some laxatives before going to bed last night, had some diarrhea and has not been drinking a lot of fluids. Standing up in her kitchen today, turned to get a plate, felt dizzy and woke up on the floor.  She did fall backwards and hit her head; however, stated she was holding on to her walker the whole time and doesn't think she hit the floor very hard.  She says that she actually did not loose consciousness.  Mandy, RN came to check on her and BP was very elevated. 200/90, rechecked and it was 178/76, HR 76.  Pt feels better now.  No chest pain.  No palpitations. No dizziness now.   Occ gets vertigo as well and says these symptoms were similar. No palpitations  Took Meclizine and feels a little better. Just took her blood pressure medication.  Current Outpatient Prescriptions on File Prior to Visit  Medication Sig Dispense Refill  . albuterol (PROAIR HFA) 108 (90 BASE) MCG/ACT inhaler Inhale 2 puffs into the lungs 4 (four) times daily as needed.        . citalopram (CELEXA) 40 MG tablet TAKE 1 TABLET BY MOUTH EVERY DAY  30 tablet  11  . conjugated estrogens (PREMARIN) vaginal cream Place vaginally 3 (three) times a week.  42.5 g  3  . cyanocobalamin (,VITAMIN B-12,) 1000 MCG/ML injection INJECT 1 ML INTRAMUSCARLY EVERY 30 DAYS  1 mL  6  . Fluticasone-Salmeterol (ADVAIR DISKUS) 100-50 MCG/DOSE AEPB Inhale 1 puff into the lungs 2 (two) times daily. Please rinse mouth after       . HYDROcodone-acetaminophen (VICODIN) 5-500 MG per tablet Take 0.5-1 tablets by mouth 3 (three) times daily as needed for pain.  90 tablet  0  . hydrocortisone 2.5 % cream Apply 1 application topically 3 (three) times daily as needed.   60 g  1  . lisinopril-hydrochlorothiazide (PRINZIDE,ZESTORETIC) 20-25 MG per tablet TAKE 1 TABLET BY MOUTH ONCE A DAY  30 tablet  6  . methylphenidate (RITALIN) 5 MG tablet Take 1 tablet (5 mg total) by mouth daily with breakfast.  30 tablet  0  . Multiple Vitamins-Minerals (MULTIVITAL) tablet Take 1 tablet by mouth daily.        . naproxen (NAPROSYN) 500 MG tablet TAKE 1 TABLET BY MOUTH TWICE A DAY FOR KNEE OR BACK PAIN  60 tablet  9  . nystatin (MYCOSTATIN) cream APPLY THREE TIMES A DAY AS NEEDED  30 g  1  . omeprazole (PRILOSEC) 40 MG capsule Take 40 mg by mouth daily.         Current Facility-Administered Medications on File Prior to Visit  Medication Dose Route Frequency Provider Last Rate Last Dose  . triamcinolone acetonide (KENALOG-40) injection 40 mg  40 mg Intra-articular Once Varney Baas, MD        Allergies  Allergen Reactions  . Meloxicam     REACTION: leg swellling  . Promethazine Hcl     REACTION: BONKERS PER PATIENT  . Sulfonamide Derivatives     REACTION: rash    Past Medical History  Diagnosis Date  . Anxiety   . Depression   . GERD (gastroesophageal reflux disease)   .  Hyperlipemia   . HTN (hypertension) 2003  . Osteoarthritis     knees  . Lumbar spinal stenosis   . Pernicious anemia   . Diabetes mellitus, type 2   . Asthma     Past Surgical History  Procedure Date  . Other surgical history 1999    Ruptured disc repair  . Abdominal hysterectomy 1984  . Tonsillectomy as a child  . Total knee arthroplasty 9/11    Dr Rosita Kea    Family History  Problem Relation Age of Onset  . Lung cancer Mother   . Hypertension Sister   . Emphysema Father   . COPD Brother   . Other Sister     balance issues  . Coronary artery disease Neg Hx   . Cancer Neg Hx     no other cancer    History   Social History  . Marital Status: Widowed    Spouse Name: N/A    Number of Children: 2  . Years of Education: N/A   Occupational History  . Retired Engineer, manufacturing    Social History Main Topics  . Smoking status: Former Smoker    Quit date: 08/20/1986  . Smokeless tobacco: Never Used   Comment: Quit 1988  . Alcohol Use: No  . Drug Use: Not on file  . Sexually Active: Not on file   Other Topics Concern  . 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 Health care POA is son, then daughter. Never considered DNR or feeding tub-discussed.    Review of Systems Appetite is fair Weight is up a few pounds from last time Sleeps okay    Objective:   Physical Exam  BP 177/85  Pulse 81  Ht 5\' 3"  (1.6 m) BP Readings from Last 3 Encounters:  07/11/11 177/85  05/18/11 161/76  04/16/11 163/92    Constitutional: She appears well-developed and well-nourished. No distress.  Neck: Normal range of motion. Neck supple. No thyromegaly present.  Cardiovascular: Normal rate, regular rhythm and normal heart sounds.  Exam reveals no gallop.   No murmur heard. Pulmonary/Chest: Effort normal and breath sounds normal. No respiratory distress. She has no wheezes. She has no rales.  Abdominal: Soft. There is no tenderness.  Lymphadenopathy:    She has no cervical adenopathy.  Psychiatric: Her behavior is normal. Judgment and thought content normal.        Assessment & Plan:   1. Syncope    New -more consistent with vertigo and subsequent fall that syncope. BP remains elevated but just took her medication. Refused EKG because lying down worsens her vertigo. Discussed with her daughter and with Eliberto Ivory will recheck her BP this afternoon. Daughter will stay with her today and tomorrow. If any new symptoms, she will go straight to ER.

## 2011-07-17 ENCOUNTER — Ambulatory Visit (INDEPENDENT_AMBULATORY_CARE_PROVIDER_SITE_OTHER): Payer: Medicare Other | Admitting: Internal Medicine

## 2011-07-17 ENCOUNTER — Encounter: Payer: Self-pay | Admitting: Internal Medicine

## 2011-07-17 DIAGNOSIS — M1710 Unilateral primary osteoarthritis, unspecified knee: Secondary | ICD-10-CM

## 2011-07-17 DIAGNOSIS — I10 Essential (primary) hypertension: Secondary | ICD-10-CM

## 2011-07-17 DIAGNOSIS — R55 Syncope and collapse: Secondary | ICD-10-CM

## 2011-07-17 DIAGNOSIS — F329 Major depressive disorder, single episode, unspecified: Secondary | ICD-10-CM

## 2011-07-17 NOTE — Progress Notes (Signed)
Subjective:    Patient ID: Bailey Ramirez, female    DOB: September 11, 1928, 77 y.o.   MRN: [id number]  HPI Feels the fainting spell was vertigo related---had spinning sensation after Had funny feeling in head for a while ??vasovagal component No dizziness since then  Still satisfied with the med for depression Mostly uses the ritalin in the AM---tried lunch dose but had side effects Tolerates the "lack of energy" in the afternoon--not somnolent though  Vaginal discomfort is better Estrogen mostly--and the hydrocortisone--seem to have helped considerably  Arthritis pain is worse--back and shoulders Has kept up with Dr Rosita Kea Last cortisone shot in left knee only lasted for 1 day! Got another steroid shot which helped till she fell and bruised it (6 days ago) Uses the hydrocodone in evening only--usually one a day  Current Outpatient Prescriptions on File Prior to Visit  Medication Sig Dispense Refill  . albuterol (PROAIR HFA) 108 (90 BASE) MCG/ACT inhaler Inhale 2 puffs into the lungs 4 (four) times daily as needed.        . citalopram (CELEXA) 40 MG tablet TAKE 1 TABLET BY MOUTH EVERY DAY  30 tablet  11  . conjugated estrogens (PREMARIN) vaginal cream Place vaginally 3 (three) times a week.  42.5 g  3  . cyanocobalamin (,VITAMIN B-12,) 1000 MCG/ML injection INJECT 1 ML INTRAMUSCARLY EVERY 30 DAYS  1 mL  6  . Fluticasone-Salmeterol (ADVAIR DISKUS) 100-50 MCG/DOSE AEPB Inhale 1 puff into the lungs 2 (two) times daily. Please rinse mouth after       . HYDROcodone-acetaminophen (VICODIN) 5-500 MG per tablet Take 0.5-1 tablets by mouth 3 (three) times daily as needed for pain.  90 tablet  0  . hydrocortisone 2.5 % cream Apply 1 application topically 3 (three) times daily as needed.  60 g  1  . lisinopril-hydrochlorothiazide (PRINZIDE,ZESTORETIC) 20-25 MG per tablet TAKE 1 TABLET BY MOUTH ONCE A DAY  30 tablet  6  . methylphenidate (RITALIN) 5 MG tablet Take 1 tablet (5 mg total) by mouth daily with  breakfast.  30 tablet  0  . Multiple Vitamins-Minerals (MULTIVITAL) tablet Take 1 tablet by mouth daily.        . naproxen (NAPROSYN) 500 MG tablet TAKE 1 TABLET BY MOUTH TWICE A DAY FOR KNEE OR BACK PAIN  60 tablet  9  . nystatin (MYCOSTATIN) cream APPLY THREE TIMES A DAY AS NEEDED  30 g  1  . omeprazole (PRILOSEC) 40 MG capsule Take 40 mg by mouth daily.         Current Facility-Administered Medications on File Prior to Visit  Medication Dose Route Frequency Provider Last Rate Last Dose  . triamcinolone acetonide (KENALOG-40) injection 40 mg  40 mg Intra-articular Once Varney Baas, MD        Allergies  Allergen Reactions  . Meloxicam     REACTION: leg swellling  . Promethazine Hcl     REACTION: BONKERS PER PATIENT  . Sulfonamide Derivatives     REACTION: rash    Past Medical History  Diagnosis Date  . Anxiety   . Depression   . GERD (gastroesophageal reflux disease)   . Hyperlipemia   . HTN (hypertension) 2003  . Osteoarthritis     knees  . Lumbar spinal stenosis   . Pernicious anemia   . Diabetes mellitus, type 2   . Asthma     Past Surgical History  Procedure Date  . Other surgical history 1999    Ruptured disc repair  .  Abdominal hysterectomy 1984  . Tonsillectomy as a child  . Total knee arthroplasty 9/11    Dr Rosita Kea    Family History  Problem Relation Age of Onset  . Lung cancer Mother   . Hypertension Sister   . Emphysema Father   . COPD Brother   . Other Sister     balance issues  . Coronary artery disease Neg Hx   . Cancer Neg Hx     no other cancer    History   Social History  . Marital Status: Widowed    Spouse Name: N/A    Number of Children: 2  . Years of Education: N/A   Occupational History  . Retired Print production planner    Social History Main Topics  . Smoking status: Former Smoker    Quit date: 08/20/1986  . Smokeless tobacco: Never Used   Comment: Quit 1988  . Alcohol Use: No  . Drug Use: Not on file  . Sexually Active:  Not on file   Other Topics Concern  . 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 Health care POA is son, then daughter. Never considered DNR or feeding tub-discussed.    Review of Systems Appetite is okay Weight is stable Sleeps well    Objective:   Physical Exam  Constitutional: She appears well-developed and well-nourished. No distress.  Neck: Normal range of motion.  Cardiovascular: Normal rate, regular rhythm and normal heart sounds.  Exam reveals no gallop.   No murmur heard. Pulmonary/Chest: Effort normal and breath sounds normal. No respiratory distress. She has no wheezes. She has no rales.  Musculoskeletal: She exhibits no edema and no tenderness.  Lymphadenopathy:    She has no cervical adenopathy.  Psychiatric: She has a normal mood and affect. Her behavior is normal. Judgment and thought content normal.          Assessment & Plan:

## 2011-07-17 NOTE — Assessment & Plan Note (Signed)
No recurrence  Sounds multifactorial No changes

## 2011-07-17 NOTE — Assessment & Plan Note (Signed)
Still doing well with the ritalin Uses it once a day usually

## 2011-07-17 NOTE — Assessment & Plan Note (Signed)
Has ongoing issues Seeing Dr Rosita Kea also Uses the hydrocodone in evening

## 2011-07-17 NOTE — Assessment & Plan Note (Signed)
BP Readings from Last 3 Encounters:  07/17/11 142/76  07/11/11 177/87  05/18/11 161/76   Good control No changes needed Lab Results  Component Value Date   CREATININE 1.4* 03/05/2011

## 2011-07-20 ENCOUNTER — Ambulatory Visit: Payer: Medicare Other | Admitting: Internal Medicine

## 2011-08-07 ENCOUNTER — Other Ambulatory Visit: Payer: Self-pay | Admitting: Internal Medicine

## 2011-08-07 NOTE — Telephone Encounter (Signed)
Request refill on Ritalin.

## 2011-08-08 MED ORDER — METHYLPHENIDATE HCL 5 MG PO TABS: 5.0000 mg | ORAL_TABLET | Freq: Two times a day (BID) | ORAL | Status: DC

## 2011-08-08 NOTE — Telephone Encounter (Signed)
Spoke with patient and advised results   

## 2011-10-08 ENCOUNTER — Other Ambulatory Visit: Payer: Self-pay | Admitting: *Deleted

## 2011-10-08 MED ORDER — METHYLPHENIDATE HCL 5 MG PO TABS: 5.0000 mg | ORAL_TABLET | Freq: Two times a day (BID) | ORAL | Status: DC

## 2011-10-08 NOTE — Telephone Encounter (Signed)
Spoke with patient and advised rx ready for pick-up and it will be at the front desk.  

## 2011-10-08 NOTE — Telephone Encounter (Signed)
Patient called requesting a refill on her Ritalin. Please call when script is ready for pickup.

## 2011-10-22 ENCOUNTER — Other Ambulatory Visit: Payer: Self-pay | Admitting: *Deleted

## 2011-10-22 MED ORDER — CYANOCOBALAMIN 1000 MCG/ML IJ SOLN: 1000.0000 ug | INTRAMUSCULAR | Status: DC

## 2011-10-22 MED ORDER — LISINOPRIL-HYDROCHLOROTHIAZIDE 20-25 MG PO TABS: 1.0000 | ORAL_TABLET | Freq: Every day | ORAL | Status: DC

## 2011-11-14 ENCOUNTER — Other Ambulatory Visit: Payer: Self-pay | Admitting: Internal Medicine

## 2011-11-14 ENCOUNTER — Ambulatory Visit (INDEPENDENT_AMBULATORY_CARE_PROVIDER_SITE_OTHER): Payer: Medicare Other | Admitting: Internal Medicine

## 2011-11-14 ENCOUNTER — Encounter: Payer: Self-pay | Admitting: Internal Medicine

## 2011-11-14 VITALS — BP 150/80 | HR 70 | Temp 98.4°F | Ht 63.0 in | Wt 209.0 lb

## 2011-11-14 DIAGNOSIS — IMO0002 Reserved for concepts with insufficient information to code with codable children: Secondary | ICD-10-CM

## 2011-11-14 DIAGNOSIS — I10 Essential (primary) hypertension: Secondary | ICD-10-CM

## 2011-11-14 DIAGNOSIS — E119 Type 2 diabetes mellitus without complications: Secondary | ICD-10-CM

## 2011-11-14 DIAGNOSIS — D510 Vitamin B12 deficiency anemia due to intrinsic factor deficiency: Secondary | ICD-10-CM

## 2011-11-14 DIAGNOSIS — J45909 Unspecified asthma, uncomplicated: Secondary | ICD-10-CM

## 2011-11-14 DIAGNOSIS — M1710 Unilateral primary osteoarthritis, unspecified knee: Secondary | ICD-10-CM

## 2011-11-14 DIAGNOSIS — F3289 Other specified depressive episodes: Secondary | ICD-10-CM

## 2011-11-14 DIAGNOSIS — F329 Major depressive disorder, single episode, unspecified: Secondary | ICD-10-CM

## 2011-11-14 LAB — BASIC METABOLIC PANEL
BUN: 34 mg/dL — ABNORMAL HIGH (ref 6–23)
CO2: 25 mEq/L (ref 19–32)
Chloride: 106 mEq/L (ref 96–112)
Creatinine, Ser: 1.6 mg/dL — ABNORMAL HIGH (ref 0.4–1.2)
Glucose, Bld: 92 mg/dL (ref 70–99)

## 2011-11-14 LAB — MICROALBUMIN / CREATININE URINE RATIO
Creatinine,U: 130.9 mg/dL
Microalb, Ur: 8.2 mg/dL — ABNORMAL HIGH (ref 0.0–1.9)

## 2011-11-14 LAB — CBC WITH DIFFERENTIAL/PLATELET
Basophils Absolute: 0.1 10*3/uL (ref 0.0–0.1)
Eosinophils Absolute: 0.6 10*3/uL (ref 0.0–0.7)
Lymphs Abs: 2.3 10*3/uL (ref 0.7–4.0)
MCHC: 32.8 g/dL (ref 30.0–36.0)
MCV: 88.9 fl (ref 78.0–100.0)
Monocytes Absolute: 0.9 10*3/uL (ref 0.1–1.0)
Neutrophils Relative %: 48.5 % (ref 43.0–77.0)
Platelets: 182 10*3/uL (ref 150.0–400.0)
RDW: 13 % (ref 11.5–14.6)
WBC: 7.7 10*3/uL (ref 4.5–10.5)

## 2011-11-14 LAB — TSH: TSH: 0.89 u[IU]/mL (ref 0.35–5.50)

## 2011-11-14 LAB — HEPATIC FUNCTION PANEL
Albumin: 4 g/dL (ref 3.5–5.2)
Total Bilirubin: 0.4 mg/dL (ref 0.3–1.2)

## 2011-11-14 LAB — LDL CHOLESTEROL, DIRECT: Direct LDL: 141.3 mg/dL

## 2011-11-14 LAB — LIPID PANEL
Cholesterol: 209 mg/dL — ABNORMAL HIGH (ref 0–200)
Total CHOL/HDL Ratio: 5

## 2011-11-14 LAB — VITAMIN B12: Vitamin B-12: >1500 pg/mL — ABNORMAL HIGH (ref 211–911)

## 2011-11-14 MED ORDER — METHYLPHENIDATE HCL 5 MG PO TABS: 5.0000 mg | ORAL_TABLET | Freq: Two times a day (BID) | ORAL | Status: DC

## 2011-11-14 NOTE — Assessment & Plan Note (Signed)
BP Readings from Last 3 Encounters:  11/14/11 150/80  07/17/11 142/76  07/11/11 177/87   Not quite at goal now Will increase lisinopril if urine microal positive

## 2011-11-14 NOTE — Assessment & Plan Note (Signed)
Ongoing issues Needs to get back to pool exercises

## 2011-11-14 NOTE — Assessment & Plan Note (Signed)
Controlled on meds Still low energy levels in afternoon Urged her to get out in sun then and try to walk some

## 2011-11-14 NOTE — Assessment & Plan Note (Signed)
Hopefully still has good control No meds

## 2011-11-14 NOTE — Assessment & Plan Note (Signed)
Has been better Not on the advair anymore

## 2011-11-14 NOTE — Progress Notes (Signed)
Subjective:    Patient ID: Bailey Ramirez, female    DOB: 1929/02/10, 78 y.o.   MRN: [id number]  HPI Doing well No new concerns  Still has a lack of energy Did feel she had a lag when vitamin b12 shot is due Uses the ritalin in AM--this helps some Tried the lunch dose--helped some but affected sleep Does social activities in morning  Depression generally controlled  Ongoing joint problems---arms, legs, etc Continues with Dr Rosita Kea Did do pool exercises till the cool weather hit---needs to restart  No chest pain No SOB Does cough some in AM---relates to asthma. Albuterol does help--uses it ~2-3 times per week Not on advair anymore  Doesn't check sugars No meds  Current Outpatient Prescriptions on File Prior to Visit  Medication Sig Dispense Refill  . albuterol (PROAIR HFA) 108 (90 BASE) MCG/ACT inhaler Inhale 2 puffs into the lungs 4 (four) times daily as needed.        . citalopram (CELEXA) 40 MG tablet TAKE 1 TABLET BY MOUTH EVERY DAY  30 tablet  11  . conjugated estrogens (PREMARIN) vaginal cream Place vaginally 3 (three) times a week.  42.5 g  3  . cyanocobalamin (,VITAMIN B-12,) 1000 MCG/ML injection Inject 1 mL (1,000 mcg total) into the muscle every 30 (thirty) days.  1 mL  6  . Fluticasone-Salmeterol (ADVAIR DISKUS) 100-50 MCG/DOSE AEPB Inhale 1 puff into the lungs 2 (two) times daily. Please rinse mouth after       . HYDROcodone-acetaminophen (VICODIN) 5-500 MG per tablet Take 0.5-1 tablets by mouth 3 (three) times daily as needed for pain.  90 tablet  0  . hydrocortisone 2.5 % cream Apply 1 application topically 3 (three) times daily as needed.  60 g  1  . lisinopril-hydrochlorothiazide (PRINZIDE,ZESTORETIC) 20-25 MG per tablet Take 1 tablet by mouth daily.  30 tablet  11  . methylphenidate (RITALIN) 5 MG tablet Take 1 tablet (5 mg total) by mouth 2 (two) times daily. As directed  60 tablet  0  . Multiple Vitamins-Minerals (MULTIVITAL) tablet Take 1 tablet by mouth daily.         . naproxen (NAPROSYN) 500 MG tablet TAKE 1 TABLET BY MOUTH TWICE A DAY FOR KNEE OR BACK PAIN  60 tablet  9  . nystatin (MYCOSTATIN) cream APPLY THREE TIMES A DAY AS NEEDED  30 g  1  . omeprazole (PRILOSEC) 40 MG capsule Take 40 mg by mouth daily.         Current Facility-Administered Medications on File Prior to Visit  Medication Dose Route Frequency Provider Last Rate Last Dose  . triamcinolone acetonide (KENALOG-40) injection 40 mg  40 mg Intra-articular Once Karie Schwalbe, MD        Allergies  Allergen Reactions  . Meloxicam     REACTION: leg swellling  . Promethazine Hcl     REACTION: BONKERS PER PATIENT  . Sulfonamide Derivatives     REACTION: rash    Past Medical History  Diagnosis Date  . Anxiety   . Depression   . GERD (gastroesophageal reflux disease)   . Hyperlipemia   . HTN (hypertension) 2003  . Osteoarthritis     knees  . Lumbar spinal stenosis   . Pernicious anemia   . Diabetes mellitus, type 2   . Asthma     Past Surgical History  Procedure Date  . Other surgical history 1999    Ruptured disc repair  . Abdominal hysterectomy 1984  . Tonsillectomy as  a child  . Total knee arthroplasty 9/11    Dr Rosita Kea    Family History  Problem Relation Age of Onset  . Lung cancer Mother   . Hypertension Sister   . Emphysema Father   . COPD Brother   . Other Sister     balance issues  . Coronary artery disease Neg Hx   . Cancer Neg Hx     no other cancer    History   Social History  . Marital Status: Widowed    Spouse Name: N/A    Number of Children: 2  . Years of Education: N/A   Occupational History  . Retired Print production planner    Social History Main Topics  . Smoking status: Former Smoker    Quit date: 08/20/1986  . Smokeless tobacco: Never Used   Comment: Quit 1988  . Alcohol Use: No  . Drug Use: Not on file  . Sexually Active: Not on file   Other Topics Concern  . 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 Health care POA is son, then daughter. Never considered DNR or feeding tub-discussed.    Review of Systems Appetite is okay Weight fairly stable Sleeps okay though some problems with initiation     Objective:   Physical Exam  Constitutional: She appears well-developed. No distress.  Neck: Normal range of motion. Neck supple. No thyromegaly present.  Cardiovascular: Normal rate, regular rhythm, normal heart sounds and intact distal pulses.  Exam reveals no gallop.   No murmur heard. Pulmonary/Chest: Effort normal and breath sounds normal. No respiratory distress. She has no wheezes. She has no rales.  Musculoskeletal: She exhibits no edema.  Lymphadenopathy:    She has no cervical adenopathy.  Skin:       No foot ulcers or rash Mycotic toenails  Psychiatric: She has a normal mood and affect. Her behavior is normal.          Assessment & Plan:

## 2011-11-14 NOTE — Assessment & Plan Note (Signed)
Feels she tires before 30 days Will change to every 21 days---given by [facility]

## 2011-11-28 ENCOUNTER — Encounter: Payer: Self-pay | Admitting: *Deleted

## 2012-01-08 ENCOUNTER — Other Ambulatory Visit: Payer: Self-pay | Admitting: *Deleted

## 2012-01-08 MED ORDER — OMEPRAZOLE 40 MG PO CPDR: 40.0000 mg | DELAYED_RELEASE_CAPSULE | Freq: Every day | ORAL | Status: DC

## 2012-01-09 ENCOUNTER — Other Ambulatory Visit: Payer: Self-pay

## 2012-01-09 MED ORDER — METHYLPHENIDATE HCL 5 MG PO TABS: 5.0000 mg | ORAL_TABLET | Freq: Two times a day (BID) | ORAL | Status: DC

## 2012-01-09 NOTE — Telephone Encounter (Signed)
Pt request written rx for Ritalin. Call when ready.

## 2012-01-09 NOTE — Telephone Encounter (Signed)
Phone just rang no answering machine, will try again later.

## 2012-01-22 ENCOUNTER — Telehealth: Payer: Self-pay

## 2012-01-22 MED ORDER — ZOSTER VACCINE LIVE 19400 UNT/0.65ML ~~LOC~~ SOLR: 0.6500 mL | Freq: Once | SUBCUTANEOUS | Status: DC

## 2012-01-22 NOTE — Telephone Encounter (Signed)
Okay to send Rx for zostavax 

## 2012-01-22 NOTE — Telephone Encounter (Signed)
Pt request rx for shingles injection to [facility] fax# [phone].

## 2012-01-22 NOTE — Telephone Encounter (Signed)
.  left message to have for patient that rx was called to pharmacy

## 2012-02-14 ENCOUNTER — Other Ambulatory Visit: Payer: Self-pay

## 2012-02-14 NOTE — Telephone Encounter (Signed)
Pt request rx Ritalin. Call when ready for pick up. 

## 2012-02-15 MED ORDER — METHYLPHENIDATE HCL 5 MG PO TABS: 5.0000 mg | ORAL_TABLET | Freq: Two times a day (BID) | ORAL | Status: DC

## 2012-02-15 NOTE — Telephone Encounter (Signed)
Left message on machine that rx is ready for pick-up, and it will be at our front desk.  

## 2012-02-20 ENCOUNTER — Other Ambulatory Visit: Payer: Self-pay

## 2012-02-20 MED ORDER — CYANOCOBALAMIN 1000 MCG/ML IJ SOLN: 1000.0000 ug | INTRAMUSCULAR | Status: DC

## 2012-02-20 NOTE — Telephone Encounter (Signed)
LMOM for patient to let us know her pharmacy.

## 2012-02-20 NOTE — Telephone Encounter (Signed)
rx sent to pharmacy by e-script rx sent to pharmacy by e-script

## 2012-02-20 NOTE — Telephone Encounter (Signed)
Pt left v/m needs refill Vit B 12. Did not leave pharmacy and no answer at contact #. Forward to [place] as instructed.

## 2012-03-21 ENCOUNTER — Other Ambulatory Visit: Payer: Self-pay

## 2012-03-21 MED ORDER — METHYLPHENIDATE HCL 5 MG PO TABS: 5.0000 mg | ORAL_TABLET | Freq: Two times a day (BID) | ORAL | Status: DC

## 2012-03-21 NOTE — Telephone Encounter (Signed)
Spoke with patient to let her know Rx is ready for pickup

## 2012-03-21 NOTE — Telephone Encounter (Signed)
Pt request rx Ritalin. Call when ready for pick up. 

## 2012-03-24 ENCOUNTER — Emergency Department: Payer: Self-pay | Admitting: Emergency Medicine

## 2012-03-28 ENCOUNTER — Ambulatory Visit: Payer: Self-pay | Admitting: Unknown Physician Specialty

## 2012-04-08 ENCOUNTER — Encounter: Payer: Self-pay | Admitting: Internal Medicine

## 2012-04-15 ENCOUNTER — Ambulatory Visit (INDEPENDENT_AMBULATORY_CARE_PROVIDER_SITE_OTHER): Payer: Medicare Other | Admitting: Family Medicine

## 2012-04-15 ENCOUNTER — Encounter: Payer: Self-pay | Admitting: Family Medicine

## 2012-04-15 VITALS — BP 152/82 | HR 82 | Temp 98.3°F | Wt 206.0 lb

## 2012-04-15 DIAGNOSIS — D649 Anemia, unspecified: Secondary | ICD-10-CM

## 2012-04-15 DIAGNOSIS — D510 Vitamin B12 deficiency anemia due to intrinsic factor deficiency: Secondary | ICD-10-CM

## 2012-04-15 DIAGNOSIS — I10 Essential (primary) hypertension: Secondary | ICD-10-CM

## 2012-04-15 LAB — BASIC METABOLIC PANEL
BUN: 29 mg/dL — ABNORMAL HIGH (ref 6–23)
GFR: 36.86 mL/min — ABNORMAL LOW (ref >60.00)
Glucose, Bld: 90 mg/dL (ref 70–99)
Potassium: 5 mEq/L (ref 3.5–5.1)

## 2012-04-15 LAB — CBC WITH DIFFERENTIAL/PLATELET
Basophils Absolute: 0.1 10*3/uL (ref 0.0–0.1)
HCT: 28.5 % — ABNORMAL LOW (ref 36.0–46.0)
Lymphs Abs: 1.8 10*3/uL (ref 0.7–4.0)
Monocytes Absolute: 0.7 10*3/uL (ref 0.1–1.0)
Monocytes Relative: 10.2 % (ref 3.0–12.0)
Platelets: 198 10*3/uL (ref 150.0–400.0)
RDW: 12.4 % (ref 11.5–14.6)

## 2012-04-15 MED ORDER — AMLODIPINE BESYLATE 5 MG PO TABS: 2.5000 mg | ORAL_TABLET | Freq: Every day | ORAL | Status: DC

## 2012-04-15 NOTE — Assessment & Plan Note (Signed)
With resolved rectal bleeding s/p GI eval.  Will recheck CBC today.

## 2012-04-15 NOTE — Assessment & Plan Note (Signed)
Not controlled, add on 2.5 mg amlodipine a day for ~1 week.  If tolerated but BP still elevated, then inc to 5mg  a day.  She agrees.  See notes on labs.  She'll call back with update.

## 2012-04-15 NOTE — Patient Instructions (Addendum)
Go to the lab on the way out.  We'll contact you with your lab report. Take 2.5mg  of amlodipine a day for 1 week.  If tolerated but BP is still >150/90, then increase to 5mg  a day.  Let us know about your blood pressure readings and amount of swelling in about 2-3 weeks, sooner if needed.  Take care.

## 2012-04-15 NOTE — Progress Notes (Signed)
Pt has been diffusely elevated over last few weeks. SBP 150-200/80-100.  No CP, occ SOB with exertion but this is longstanding and at baseline.  Occ BLE edema, if on her feet.  Better with foot elevation.  No clear cause for the BP elevation known- no salt load.  Sleeping on 1 pillow.  No orthopnea. Still taking her BP meds.    Had f/u with Dr. Mechele Collin about her rectal bleeding.  HGB was 9.5 at UC. She was found to have int hemorrhoids and rectal bleeding is much improved now.    PMH and SH reviewed  ROS: See HPI, otherwise noncontributory.  Meds, vitals, and allergies reviewed.   nad Walks with a walker Mmm rrr ctab abd soft, not ttp Ext with trace edema B

## 2012-04-24 ENCOUNTER — Other Ambulatory Visit: Payer: Self-pay | Admitting: Internal Medicine

## 2012-04-28 ENCOUNTER — Other Ambulatory Visit: Payer: Self-pay

## 2012-04-28 NOTE — Telephone Encounter (Signed)
Pt request rx Ritalin. Call when ready for pick up (pt request pick up on 04/29/12.

## 2012-04-29 MED ORDER — METHYLPHENIDATE HCL 5 MG PO TABS: 5.0000 mg | ORAL_TABLET | Freq: Two times a day (BID) | ORAL | Status: DC

## 2012-04-29 NOTE — Telephone Encounter (Signed)
Spoke with patient and advised rx ready for pick-up and it will be at the front desk.  

## 2012-05-16 ENCOUNTER — Encounter: Payer: Self-pay | Admitting: Internal Medicine

## 2012-05-16 ENCOUNTER — Ambulatory Visit (INDEPENDENT_AMBULATORY_CARE_PROVIDER_SITE_OTHER): Payer: Medicare Other | Admitting: Internal Medicine

## 2012-05-16 VITALS — BP 150/74 | HR 76 | Temp 98.5°F | Wt 206.0 lb

## 2012-05-16 DIAGNOSIS — I10 Essential (primary) hypertension: Secondary | ICD-10-CM

## 2012-05-16 DIAGNOSIS — F329 Major depressive disorder, single episode, unspecified: Secondary | ICD-10-CM

## 2012-05-16 DIAGNOSIS — M48061 Spinal stenosis, lumbar region without neurogenic claudication: Secondary | ICD-10-CM

## 2012-05-16 DIAGNOSIS — E119 Type 2 diabetes mellitus without complications: Secondary | ICD-10-CM

## 2012-05-16 DIAGNOSIS — Z23 Encounter for immunization: Secondary | ICD-10-CM

## 2012-05-16 DIAGNOSIS — F3289 Other specified depressive episodes: Secondary | ICD-10-CM

## 2012-05-16 LAB — CBC WITH DIFFERENTIAL/PLATELET
Basophils Absolute: 0.1 10*3/uL (ref 0.0–0.1)
Basophils Relative: 1.5 % (ref 0.0–3.0)
Eosinophils Relative: 8 % — ABNORMAL HIGH (ref 0.0–5.0)
HCT: 30.4 % — ABNORMAL LOW (ref 36.0–46.0)
Hemoglobin: 9.8 g/dL — ABNORMAL LOW (ref 12.0–15.0)
Lymphs Abs: 2.3 10*3/uL (ref 0.7–4.0)
Monocytes Relative: 11 % (ref 3.0–12.0)
Neutro Abs: 4.2 10*3/uL (ref 1.4–7.7)
RBC: 3.49 Mil/uL — ABNORMAL LOW (ref 3.87–5.11)
RDW: 13.2 % (ref 11.5–14.6)

## 2012-05-16 LAB — HEMOGLOBIN A1C: Hgb A1c MFr Bld: 5.9 % (ref 4.6–6.5)

## 2012-05-16 NOTE — Assessment & Plan Note (Signed)
BP Readings from Last 3 Encounters:  05/16/12 150/74  04/15/12 152/82  11/14/11 150/80   For her age, this is probably acceptable No changes for now

## 2012-05-16 NOTE — Assessment & Plan Note (Signed)
Melancholy and poor energy levels May be related to ongoing anemia Discussed starting iron again

## 2012-05-16 NOTE — Addendum Note (Signed)
Addended by: Sueanne Margarita on: 05/16/2012 01:05 PM   Modules accepted: Orders

## 2012-05-16 NOTE — Assessment & Plan Note (Signed)
Lab Results  Component Value Date   HGBA1C 6.0 11/14/2011   This diagnosis is in question If A1c normal again, will take this off her list

## 2012-05-16 NOTE — Assessment & Plan Note (Signed)
Activity is limited Discussed trying pool exercises again

## 2012-05-16 NOTE — Progress Notes (Signed)
Subjective:    Patient ID: Bailey Ramirez, female    DOB: 1928/11/16, 78 y.o.   MRN: [id number]  HPI Has gone up to the 5mg  of amlodipine Did have initial headache that seems to be better now Also having some facial hot flashes Uses wrist BP cuff--- gets fairly high readings 160-180/80-90 Mandy got 158/70 No headaches now No chest pain No SOB---except notes coughing with pollen and she uses the advair (spring only) Slight edema at bedtime---better in AM  Not checking sugars No meds for this Eats prudently  Mood has been fairly good Still doesn't have any energy though Does okay in AM--then fades after lunchtime No exercise--just does housework Back is limiting factor--did do pool exercises in past but has slacked off on this  Current Outpatient Prescriptions on File Prior to Visit  Medication Sig Dispense Refill  . albuterol (PROAIR HFA) 108 (90 BASE) MCG/ACT inhaler Inhale 2 puffs into the lungs 4 (four) times daily as needed.        . citalopram (CELEXA) 40 MG tablet TAKE 1 TABLET BY MOUTH EVERY DAY  30 tablet  11  . conjugated estrogens (PREMARIN) vaginal cream Place vaginally 3 (three) times a week.  42.5 g  3  . cyanocobalamin (,VITAMIN B-12,) 1000 MCG/ML injection Inject 1 mL (1,000 mcg total) into the muscle every 21 ( twenty-one) days.  30 mL  3  . Fluticasone-Salmeterol (ADVAIR) 100-50 MCG/DOSE AEPB Inhale 1 puff into the lungs every 12 (twelve) hours.      Marland Kitchen HYDROcodone-acetaminophen (VICODIN) 5-500 MG per tablet Take 0.5-1 tablets by mouth 3 (three) times daily as needed for pain.  90 tablet  0  . hydrocortisone 2.5 % cream Apply 1 application topically 3 (three) times daily as needed.  60 g  1  . lisinopril-hydrochlorothiazide (PRINZIDE,ZESTORETIC) 20-25 MG per tablet Take 1 tablet by mouth daily.  30 tablet  11  . methylphenidate (RITALIN) 5 MG tablet Take 1 tablet (5 mg total) by mouth 2 (two) times daily. As directed  60 tablet  0  . Multiple Vitamins-Minerals  (MULTIVITAL) tablet Take 1 tablet by mouth daily.        . naproxen (NAPROSYN) 500 MG tablet TAKE 1 TABLET BY MOUTH TWICE A DAY FOR KNEE OR BACK PAIN  60 tablet  9  . nystatin (MYCOSTATIN) cream APPLY THREE TIMES A DAY AS NEEDED  30 g  1  . omeprazole (PRILOSEC) 40 MG capsule Take 1 capsule (40 mg total) by mouth daily.  30 capsule  11  . DISCONTD: amLODipine (NORVASC) 5 MG tablet Take 0.5-1 tablets (2.5-5 mg total) by mouth daily.  90 tablet  3  . DISCONTD: methylphenidate (RITALIN) 5 MG tablet Take 1 tablet (5 mg total) by mouth 2 (two) times daily.  60 tablet  0   Current Facility-Administered Medications on File Prior to Visit  Medication Dose Route Frequency Provider Last Rate Last Dose  . triamcinolone acetonide (KENALOG-40) injection 40 mg  40 mg Intra-articular Once Karie Schwalbe, MD        Allergies  Allergen Reactions  . Meloxicam     REACTION: leg swellling  . Promethazine Hcl     Intolerant, altered mental status  . Sulfonamide Derivatives     REACTION: rash    Past Medical History  Diagnosis Date  . Anxiety   . Depression   . GERD (gastroesophageal reflux disease)   . Hyperlipemia   . HTN (hypertension) 2003  . Osteoarthritis  knees  . Lumbar spinal stenosis   . Pernicious anemia   . Diabetes mellitus, type 2   . Asthma     Past Surgical History  Procedure Date  . Other surgical history 1999    Ruptured disc repair  . Abdominal hysterectomy 1984  . Tonsillectomy as a child  . Total knee arthroplasty 9/11    Dr Rosita Kea    Family History  Problem Relation Age of Onset  . Lung cancer Mother   . Hypertension Sister   . Emphysema Father   . COPD Brother   . Other Sister     balance issues  . Coronary artery disease Neg Hx   . Cancer Neg Hx     no other cancer    History   Social History  . Marital Status: Widowed    Spouse Name: N/A    Number of Children: 2  . Years of Education: N/A   Occupational History  . Retired Print production planner     Social History Main Topics  . Smoking status: Former Smoker    Quit date: 08/20/1986  . Smokeless tobacco: Never Used   Comment: Quit 1988  . Alcohol Use: No  . Drug Use: Not on file  . Sexually Active: Not on file   Other Topics Concern  . 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 Health care POA is son, then daughter. Never considered DNR or feeding tub-discussed.    Review of Systems Appetite is so-so Weight is stable Sleeps okay with her meds Bowels are okay---still troubled by hemorrhoids. Hasn't been back to Dr Mechele Collin for a while     Objective:   Physical Exam  Constitutional: She appears well-developed and well-nourished. No distress.  Neck: Normal range of motion. Neck supple. No thyromegaly present.  Cardiovascular: Normal rate, regular rhythm and normal heart sounds.  Exam reveals no gallop.   No murmur heard.      Faint pedal pulses  Pulmonary/Chest: Effort normal and breath sounds normal. No respiratory distress. She has no wheezes. She has no rales.  Abdominal: Soft. There is no tenderness.  Musculoskeletal: She exhibits no edema and no tenderness.  Lymphadenopathy:    She has no cervical adenopathy.  Psychiatric: She has a normal mood and affect. Her behavior is normal.          Assessment & Plan:

## 2012-05-18 ENCOUNTER — Encounter: Payer: Self-pay | Admitting: Internal Medicine

## 2012-05-20 ENCOUNTER — Encounter: Payer: Self-pay | Admitting: *Deleted

## 2012-05-29 ENCOUNTER — Other Ambulatory Visit: Payer: Self-pay

## 2012-05-29 MED ORDER — METHYLPHENIDATE HCL 5 MG PO TABS: 5.0000 mg | ORAL_TABLET | Freq: Two times a day (BID) | ORAL | Status: DC

## 2012-05-29 NOTE — Telephone Encounter (Signed)
Phone just rang, no answering machine, will leave rx up front when pt calls back.

## 2012-05-29 NOTE — Telephone Encounter (Signed)
Pt left v/m requesting rx Ritalin. Call when ready for pick up. (pt request pick up today if possible.)

## 2012-07-03 ENCOUNTER — Other Ambulatory Visit: Payer: Self-pay

## 2012-07-03 MED ORDER — METHYLPHENIDATE HCL 5 MG PO TABS: 5.0000 mg | ORAL_TABLET | Freq: Two times a day (BID) | ORAL | Status: DC

## 2012-07-03 NOTE — Telephone Encounter (Signed)
Spoke with patient and advised rx ready for pick-up and it will be at the front desk.  

## 2012-07-03 NOTE — Telephone Encounter (Signed)
Pt left v/m requesting rx Ritalin. Call when ready for pick up. 

## 2012-08-01 ENCOUNTER — Other Ambulatory Visit: Payer: Self-pay

## 2012-08-01 MED ORDER — METHYLPHENIDATE HCL 5 MG PO TABS: 5.0000 mg | ORAL_TABLET | Freq: Two times a day (BID) | ORAL | Status: DC

## 2012-08-01 NOTE — Telephone Encounter (Signed)
Left message on machine that rx is ready for pick-up, and it will be at our front desk.  

## 2012-08-01 NOTE — Telephone Encounter (Signed)
Pt left v/m requesting rx Ritalin. Call when ready for pick up. 

## 2012-09-08 ENCOUNTER — Other Ambulatory Visit: Payer: Self-pay

## 2012-09-08 MED ORDER — METHYLPHENIDATE HCL 5 MG PO TABS: 5.0000 mg | ORAL_TABLET | Freq: Two times a day (BID) | ORAL | Status: DC

## 2012-09-08 NOTE — Telephone Encounter (Signed)
Pt left v/m requesting rx ritalin.call when ready for pick up.

## 2012-09-08 NOTE — Telephone Encounter (Signed)
Left message on machine that rx is ready for pick-up, and it will be at our front desk.  

## 2012-09-22 ENCOUNTER — Other Ambulatory Visit: Payer: Self-pay | Admitting: Internal Medicine

## 2012-10-13 ENCOUNTER — Other Ambulatory Visit: Payer: Self-pay

## 2012-10-13 MED ORDER — METHYLPHENIDATE HCL 5 MG PO TABS: 5.0000 mg | ORAL_TABLET | Freq: Two times a day (BID) | ORAL | Status: DC

## 2012-10-13 NOTE — Telephone Encounter (Signed)
Spoke with patient and advised rx ready for pick-up and it will be at the front desk.  

## 2012-10-13 NOTE — Telephone Encounter (Signed)
Pt left v/m requesting rx Ritalin; pt request to pick up rx today. Call when rx ready for pick up.

## 2012-10-13 NOTE — Telephone Encounter (Signed)
Let her know she should set up a follow up appt soon

## 2012-10-22 ENCOUNTER — Other Ambulatory Visit: Payer: Self-pay | Admitting: Internal Medicine

## 2012-10-22 ENCOUNTER — Telehealth: Payer: Self-pay

## 2012-10-22 NOTE — Telephone Encounter (Signed)
There is no substitute---we have to wait till someone has it She can try oral daily She may absorb at least some of it and it could give her help until the injectable is available again

## 2012-10-22 NOTE — Telephone Encounter (Signed)
Pt said [facility] told pt no longer making Vit B !2. Spoke with Rob at [facility] he said is on manufactures back order but Rob can get vit b 12 by ordering directly from company. Advised pt could have refill transferred to [facility] and get Vit B 12 for injections. Pt said she appreciates information but wants Dr Alphonsus Sias to give her substitute because she has not had Vit B 12 injection since 09/02/12 and is feeling very weak and wants med right away.[facility].Please advise.

## 2012-10-23 NOTE — Telephone Encounter (Signed)
Spoke with patient and advised results, she will try the OTC and see if that helps.

## 2012-11-11 ENCOUNTER — Ambulatory Visit (INDEPENDENT_AMBULATORY_CARE_PROVIDER_SITE_OTHER): Payer: Medicare Other | Admitting: Internal Medicine

## 2012-11-11 ENCOUNTER — Encounter: Payer: Self-pay | Admitting: Internal Medicine

## 2012-11-11 VITALS — BP 150/80 | HR 71 | Wt 203.0 lb

## 2012-11-11 DIAGNOSIS — M48061 Spinal stenosis, lumbar region without neurogenic claudication: Secondary | ICD-10-CM

## 2012-11-11 DIAGNOSIS — D510 Vitamin B12 deficiency anemia due to intrinsic factor deficiency: Secondary | ICD-10-CM

## 2012-11-11 DIAGNOSIS — K219 Gastro-esophageal reflux disease without esophagitis: Secondary | ICD-10-CM

## 2012-11-11 DIAGNOSIS — I10 Essential (primary) hypertension: Secondary | ICD-10-CM

## 2012-11-11 DIAGNOSIS — F3289 Other specified depressive episodes: Secondary | ICD-10-CM

## 2012-11-11 DIAGNOSIS — F329 Major depressive disorder, single episode, unspecified: Secondary | ICD-10-CM

## 2012-11-11 MED ORDER — METHYLPHENIDATE HCL 5 MG PO TABS: 5.0000 mg | ORAL_TABLET | Freq: Two times a day (BID) | ORAL | Status: DC

## 2012-11-11 NOTE — Progress Notes (Signed)
Subjective:    Patient ID: Bailey Ramirez, female    DOB: 1929-04-07, 79 y.o.   MRN: [id number]  HPI Doing fairly well Discussed that sugar has been normal---not diabetic so taken off her chart  Still having back pain Lots of pain when on her feet Feels weak Didn't like the hydrocodone so just takes tylenol Doesn't think she can do the pool exercises--washes her out too much Tries to walk around court with rollator when she can Right shouder very bad---not excited about having replacement. Has seen Dr Dellis Filbert  No chest pain Occasional DOE if she pushes too fast Mild dizziness still---mostly at night, but no syncope Mild edema--no real change. Just elevates when needed  Stomach has been bothering her Not reflux---the omeprazole helps Regular BMs but may alternate constipation and going too much Some hemorrhoid issue Increased fiber did help some  Depression still an issue Frustrated with aging and not able to take care of herself (and her close family also fading) Not anhedonic  Current Outpatient Prescriptions on File Prior to Visit  Medication Sig Dispense Refill  . albuterol (PROAIR HFA) 108 (90 BASE) MCG/ACT inhaler Inhale 2 puffs into the lungs 4 (four) times daily as needed.        Marland Kitchen amLODipine (NORVASC) 5 MG tablet Take 5 mg by mouth daily.      . citalopram (CELEXA) 40 MG tablet TAKE 1 TABLET BY MOUTH EVERY DAY  30 tablet  11  . cyanocobalamin (,VITAMIN B-12,) 1000 MCG/ML injection Inject 1 mL (1,000 mcg total) into the muscle every 21 ( twenty-one) days.  30 mL  3  . hydrocortisone 2.5 % cream Apply 1 application topically 3 (three) times daily as needed.  60 g  1  . lisinopril-hydrochlorothiazide (PRINZIDE,ZESTORETIC) 20-25 MG per tablet TAKE 1 TABLET BY MOUTH EVERY DAY  30 tablet  4  . methylphenidate (RITALIN) 5 MG tablet Take 1 tablet (5 mg total) by mouth 2 (two) times daily. As directed  60 tablet  0  . Multiple Vitamins-Minerals (MULTIVITAL) tablet Take 1 tablet by  mouth daily.        . naproxen (NAPROSYN) 500 MG tablet TAKE 1 TABLET BY MOUTH TWICE A DAY FOR KNEE OR BACK PAIN  60 tablet  4  . nystatin (MYCOSTATIN) cream APPLY THREE TIMES A DAY AS NEEDED  30 g  1  . omeprazole (PRILOSEC) 40 MG capsule Take 1 capsule (40 mg total) by mouth daily.  30 capsule  11   No current facility-administered medications on file prior to visit.    Allergies  Allergen Reactions  . Meloxicam     REACTION: leg swellling  . Promethazine Hcl     Intolerant, altered mental status  . Sulfonamide Derivatives     REACTION: rash    Past Medical History  Diagnosis Date  . Anxiety   . Depression   . GERD (gastroesophageal reflux disease)   . Hyperlipemia   . HTN (hypertension) 2003  . Osteoarthritis     knees  . Lumbar spinal stenosis   . Pernicious anemia   . Asthma     Past Surgical History  Procedure Laterality Date  . Other surgical history  1999    Ruptured disc repair  . Abdominal hysterectomy  1984  . Tonsillectomy  as a child  . Total knee arthroplasty  9/11    Dr Rosita Kea    Family History  Problem Relation Age of Onset  . Lung cancer Mother   . Hypertension  Sister   . Emphysema Father   . COPD Brother   . Other Sister     balance issues  . Coronary artery disease Neg Hx   . Cancer Neg Hx     no other cancer    History   Social History  . Marital Status: Widowed    Spouse Name: N/A    Number of Children: 2  . Years of Education: N/A   Occupational History  . Retired Print production planner    Social History Main Topics  . Smoking status: Former Smoker    Quit date: 08/20/1986  . Smokeless tobacco: Never Used     Comment: Quit 1988  . Alcohol Use: No  . Drug Use: Not on file  . Sexually Active: Not on file   Other Topics Concern  . 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 Health care POA is son, then daughter. Never considered DNR or  feeding tub-discussed.    Review of Systems Usually sleeps okay Appetite is fine. Weight is down 3#     Objective:   Physical Exam  Constitutional: She appears well-developed and well-nourished. No distress.  Neck: Normal range of motion. Neck supple. No thyromegaly present.  Cardiovascular: Normal rate, regular rhythm and normal heart sounds.  Exam reveals no gallop.   No murmur heard. Pulmonary/Chest: Effort normal and breath sounds normal. No respiratory distress. She has no wheezes. She has no rales.  Abdominal: Soft. There is no tenderness.  Musculoskeletal: She exhibits no edema and no tenderness.  Lymphadenopathy:    She has no cervical adenopathy.  Psychiatric: She has a normal mood and affect. Her behavior is normal.  Sedate but not overtly depressed Appropriate affect          Assessment & Plan:

## 2012-11-11 NOTE — Assessment & Plan Note (Signed)
Ongoing pain and disability Discussed meeting with fitness specialist at [facility] to see if there is some exercise program she can do

## 2012-11-11 NOTE — Assessment & Plan Note (Signed)
Okay on the medication Needs to continue due to NSAID use

## 2012-11-11 NOTE — Assessment & Plan Note (Signed)
B12 not available She will try oral and I will check level at next visit

## 2012-11-11 NOTE — Assessment & Plan Note (Signed)
Mood not great but stable Interested in trying off the ritalin---okay to try to wean off

## 2012-11-11 NOTE — Assessment & Plan Note (Signed)
BP Readings from Last 3 Encounters:  11/11/12 150/80  05/16/12 150/74  04/15/12 152/82   Reasonable control for her age No changes

## 2012-11-11 NOTE — Patient Instructions (Addendum)
Please call Casimiro Needle at [facility] to see if he can figure out a light exercise program that you can do.  Please cut the ritalin to once a day. By April 7th, if you still feel fine, you can try to stop it completely. If your mood worsens, just go back on it.

## 2012-11-18 ENCOUNTER — Ambulatory Visit: Payer: Self-pay | Admitting: Ophthalmology

## 2012-11-18 LAB — POTASSIUM: Potassium: 4.3 mmol/L (ref 3.5–5.1)

## 2012-11-18 LAB — HEMOGLOBIN: HGB: 9.9 g/dL — ABNORMAL LOW (ref 12.0–16.0)

## 2012-11-25 ENCOUNTER — Ambulatory Visit: Payer: Self-pay | Admitting: Ophthalmology

## 2012-12-29 ENCOUNTER — Other Ambulatory Visit: Payer: Self-pay | Admitting: Internal Medicine

## 2012-12-29 ENCOUNTER — Encounter: Payer: Self-pay | Admitting: Internal Medicine

## 2012-12-30 MED ORDER — METHYLPHENIDATE HCL 5 MG PO TABS: 5.0000 mg | ORAL_TABLET | Freq: Two times a day (BID) | ORAL | Status: DC

## 2012-12-30 NOTE — Telephone Encounter (Signed)
Spoke with patient and advised rx ready for pick-up and it will be at the front desk.  

## 2013-01-06 ENCOUNTER — Ambulatory Visit: Payer: Self-pay | Admitting: Ophthalmology

## 2013-01-06 LAB — POTASSIUM: Potassium: 4.3 mmol/L (ref 3.5–5.1)

## 2013-01-06 LAB — HEMOGLOBIN: HGB: 9.9 g/dL — ABNORMAL LOW (ref 12.0–16.0)

## 2013-01-13 ENCOUNTER — Ambulatory Visit: Payer: Self-pay | Admitting: Ophthalmology

## 2013-01-20 ENCOUNTER — Other Ambulatory Visit: Payer: Self-pay | Admitting: Internal Medicine

## 2013-02-24 ENCOUNTER — Other Ambulatory Visit: Payer: Self-pay | Admitting: *Deleted

## 2013-02-24 MED ORDER — LISINOPRIL-HYDROCHLOROTHIAZIDE 20-25 MG PO TABS: ORAL_TABLET | ORAL | Status: DC

## 2013-02-24 MED ORDER — CITALOPRAM HYDROBROMIDE 40 MG PO TABS: ORAL_TABLET | ORAL | Status: DC

## 2013-02-25 ENCOUNTER — Other Ambulatory Visit: Payer: Self-pay | Admitting: *Deleted

## 2013-02-25 MED ORDER — NAPROXEN 500 MG PO TABS: ORAL_TABLET | ORAL | Status: DC

## 2013-03-20 ENCOUNTER — Other Ambulatory Visit: Payer: Self-pay | Admitting: Family Medicine

## 2013-03-20 ENCOUNTER — Other Ambulatory Visit: Payer: Self-pay | Admitting: Internal Medicine

## 2013-03-27 ENCOUNTER — Other Ambulatory Visit: Payer: Self-pay | Admitting: *Deleted

## 2013-03-27 DIAGNOSIS — R300 Dysuria: Secondary | ICD-10-CM

## 2013-03-27 MED ORDER — ESTROGENS, CONJUGATED 0.625 MG/GM VA CREA: TOPICAL_CREAM | VAGINAL | Status: AC

## 2013-03-27 NOTE — Telephone Encounter (Signed)
Pt left v/m requesting refill status of cream; spoke with pt and she has already picked up cream.

## 2013-05-27 ENCOUNTER — Ambulatory Visit (INDEPENDENT_AMBULATORY_CARE_PROVIDER_SITE_OTHER): Payer: Medicare Other | Admitting: Internal Medicine

## 2013-05-27 ENCOUNTER — Encounter: Payer: Self-pay | Admitting: Internal Medicine

## 2013-05-27 VITALS — BP 140/70 | HR 77 | Temp 98.0°F | Ht 63.0 in | Wt 207.0 lb

## 2013-05-27 DIAGNOSIS — F3289 Other specified depressive episodes: Secondary | ICD-10-CM

## 2013-05-27 DIAGNOSIS — Z0000 Encounter for general adult medical examination without abnormal findings: Secondary | ICD-10-CM

## 2013-05-27 DIAGNOSIS — I10 Essential (primary) hypertension: Secondary | ICD-10-CM

## 2013-05-27 DIAGNOSIS — F329 Major depressive disorder, single episode, unspecified: Secondary | ICD-10-CM

## 2013-05-27 DIAGNOSIS — J45909 Unspecified asthma, uncomplicated: Secondary | ICD-10-CM

## 2013-05-27 DIAGNOSIS — M48061 Spinal stenosis, lumbar region without neurogenic claudication: Secondary | ICD-10-CM

## 2013-05-27 DIAGNOSIS — Z23 Encounter for immunization: Secondary | ICD-10-CM

## 2013-05-27 LAB — CBC WITH DIFFERENTIAL/PLATELET
Basophils Relative: 0.8 % (ref 0.0–3.0)
Eosinophils Relative: 7 % — ABNORMAL HIGH (ref 0.0–5.0)
MCV: 84.9 fl (ref 78.0–100.0)
Monocytes Absolute: 0.9 10*3/uL (ref 0.1–1.0)
Monocytes Relative: 9.5 % (ref 3.0–12.0)
Neutrophils Relative %: 59.3 % (ref 43.0–77.0)
Platelets: 200 10*3/uL (ref 150.0–400.0)
RBC: 3.28 Mil/uL — ABNORMAL LOW (ref 3.87–5.11)
WBC: 9.3 10*3/uL (ref 4.5–10.5)

## 2013-05-27 LAB — HEPATIC FUNCTION PANEL
ALT: 22 U/L (ref 0–35)
AST: 49 U/L — ABNORMAL HIGH (ref 0–37)
Alkaline Phosphatase: 43 U/L (ref 39–117)
Bilirubin, Direct: 0 mg/dL (ref 0.0–0.3)
Total Bilirubin: 1 mg/dL (ref 0.3–1.2)

## 2013-05-27 LAB — BASIC METABOLIC PANEL
Chloride: 109 mEq/L (ref 96–112)
Creatinine, Ser: 2.2 mg/dL — ABNORMAL HIGH (ref 0.4–1.2)
GFR: 22.84 mL/min — ABNORMAL LOW (ref >60.00)
Potassium: 4.8 mEq/L (ref 3.5–5.1)

## 2013-05-27 LAB — TSH: TSH: 1.66 u[IU]/mL (ref 0.35–5.50)

## 2013-05-27 NOTE — Assessment & Plan Note (Signed)
BP Readings from Last 3 Encounters:  05/27/13 140/70  11/11/12 150/80  05/16/12 150/74   Good control No changes Due for labs

## 2013-05-27 NOTE — Assessment & Plan Note (Signed)
Chronic melancholy Not MDD Just on the citalopram now

## 2013-05-27 NOTE — Assessment & Plan Note (Signed)
Healthy but with increased care needs and frailty Unwilling to exercise UTD on imms No cancer screening

## 2013-05-27 NOTE — Assessment & Plan Note (Signed)
And generalized arthritis Uses the naproxen

## 2013-05-27 NOTE — Progress Notes (Signed)
Subjective:    Patient ID: Bailey Ramirez, female    DOB: 1929/04/10, 79 y.o.   MRN: [id number]  HPI Here for physical  Ongoing mild mood problems Wasn't able to start exercise --- "I just can't do it" Uses the rollator all the time Stopped the methylphenidate--didn't think it was helping  Independent with ADLs  Does her shopping but this really tires he out Housekeeper once a month Occasionally gets help from family for groceries Does get food from the [place] or [employer]--- several times weekly   Uses naproxen for her pain This seems to work fairly well Will supplement with acetaminophen also  Takes omeprazole daily  Current Outpatient Prescriptions on File Prior to Visit  Medication Sig Dispense Refill  . albuterol (PROAIR HFA) 108 (90 BASE) MCG/ACT inhaler Inhale 2 puffs into the lungs 4 (four) times daily as needed.        Marland Kitchen amLODipine (NORVASC) 5 MG tablet TAKE 0.5-1 TABLETS (2.5-5 MG TOTAL) BY MOUTH DAILY.  90 tablet  3  . citalopram (CELEXA) 40 MG tablet TAKE 1 TABLET BY MOUTH EVERY DAY  90 tablet  0  . conjugated estrogens (PREMARIN) vaginal cream Place vaginally 3 (three) times a week.  42.5 g  0  . cyanocobalamin (,VITAMIN B-12,) 1000 MCG/ML injection Inject 1 mL (1,000 mcg total) into the muscle every 21 ( twenty-one) days.  30 mL  3  . hydrocortisone 2.5 % cream Apply 1 application topically 3 (three) times daily as needed.  60 g  1  . lisinopril-hydrochlorothiazide (PRINZIDE,ZESTORETIC) 20-25 MG per tablet TAKE 1 TABLET BY MOUTH EVERY DAY  90 tablet  1  . Multiple Vitamins-Minerals (MULTIVITAL) tablet Take 1 tablet by mouth daily.        . naproxen (NAPROSYN) 500 MG tablet TAKE 1 TABLET BY MOUTH TWICE A DAY FOR KNEE OR BACK PAIN  60 tablet  1  . nystatin (MYCOSTATIN) cream APPLY THREE TIMES A DAY AS NEEDED  30 g  1  . omeprazole (PRILOSEC) 40 MG capsule TAKE ONE CAPSULE BY MOUTH EVERY DAY  30 capsule  7   No current facility-administered medications on  file prior to visit.    Allergies  Allergen Reactions  . Meloxicam     REACTION: leg swellling  . Promethazine Hcl     Intolerant, altered mental status  . Sulfonamide Derivatives     REACTION: rash    Past Medical History  Diagnosis Date  . Anxiety   . Depression   . GERD (gastroesophageal reflux disease)   . Hyperlipemia   . HTN (hypertension) 2003  . Osteoarthritis     knees  . Lumbar spinal stenosis   . Pernicious anemia   . Asthma     Past Surgical History  Procedure Laterality Date  . Other surgical history  1999    Ruptured disc repair  . Abdominal hysterectomy  1984  . Tonsillectomy  as a child  . Total knee arthroplasty  9/11    Dr Rosita Kea    Family History  Problem Relation Age of Onset  . Lung cancer Mother   . Hypertension Sister   . Emphysema Father   . COPD Brother   . Other Sister     balance issues  . Coronary artery disease Neg Hx   . Cancer Neg Hx     no other cancer    History   Social History  . Marital Status: Widowed    Spouse Name: N/A  Number of Children: 2  . Years of Education: N/A   Occupational History  . Retired Print production planner    Social History Main Topics  . Smoking status: Former Smoker    Quit date: 08/20/1986  . Smokeless tobacco: Never Used     Comment: Quit 1988  . Alcohol Use: No  . Drug Use: Not on file  . Sexual Activity: Not on file   Other Topics Concern  . Not on file   Social History Narrative   Widowed 12/09.       Has living will.    Health care POA is son, then daughter.    Has DNR order already   No feeding tube if cognitively unaware   Review of Systems  Constitutional: Positive for fatigue. Negative for unexpected weight change.       Wears seat belt  HENT: Positive for congestion, hearing loss, rhinorrhea and tinnitus. Negative for dental problem.        Has hearing aides Overdue for dentist  Eyes: Negative for visual disturbance.       Cataract surgery No unilateral vision loss   Respiratory: Positive for cough. Negative for shortness of breath.        Gets hacky cough-- uses inhaler sporadically (?twice a week)  Cardiovascular: Positive for leg swelling. Negative for chest pain and palpitations.       Edema stable  Gastrointestinal: Positive for constipation and anal bleeding. Negative for nausea, vomiting, abdominal pain and blood in stool.       Heartburn controlled Uses stool softenener Hemorrhoids--occasional blood on paper  Endocrine: Positive for cold intolerance. Negative for heat intolerance.  Genitourinary: Positive for difficulty urinating. Negative for dysuria.       Occ urge incontinence--wears pad Uses estrogen cream 2-3 times per week  Musculoskeletal: Positive for arthralgias and back pain. Negative for joint swelling.  Skin: Negative for rash.       No suspicious lesions  Allergic/Immunologic: Positive for environmental allergies. Negative for immunocompromised state.       Uses OTC allergy med  Neurological: Positive for weakness, numbness and headaches. Negative for dizziness, syncope and light-headedness.       Rare headache---tylenol helps Some left leg feeling "like pinched nerve"  Hematological: Negative for adenopathy. Bruises/bleeds easily.  Psychiatric/Behavioral: Positive for dysphoric mood. Negative for sleep disturbance. The patient is nervous/anxious.        Shaky and nervous after niece's death (in 04/25/23)       Objective:   Physical Exam  Constitutional: She is oriented to person, place, and time. She appears well-developed. No distress.  HENT:  Mouth/Throat: Oropharynx is clear and moist. No oropharyngeal exudate.  Neck: Normal range of motion. Neck supple. No thyromegaly present.  Cardiovascular: Normal rate, regular rhythm and normal heart sounds.  Exam reveals no gallop.   No murmur heard. Pedal pulses not palpable  Pulmonary/Chest: Effort normal and breath sounds normal. No respiratory distress. She has no wheezes. She  has no rales.  Abdominal: Soft. There is no tenderness. There is no rebound and no guarding.  Musculoskeletal:  Thick calves without pitting  Lymphadenopathy:    She has no cervical adenopathy.  Neurological: She is alert and oriented to person, place, and time.  President-- "Obama, Bush, Bush, then CLinton" [phone] [id number]  Generalized mild weakness and difficulty with getting on table, etc  Psychiatric: She has a normal mood and affect. Her behavior is normal.          Assessment &  Plan:

## 2013-05-27 NOTE — Assessment & Plan Note (Signed)
Feels this is the cause of her cough Discussed ACEI--- she doesn't think it is this Albuterol helps if it bothers her

## 2013-05-27 NOTE — Addendum Note (Signed)
Addended by: Sueanne Margarita on: 05/27/2013 11:34 AM   Modules accepted: Orders

## 2013-05-29 ENCOUNTER — Other Ambulatory Visit: Payer: Self-pay | Admitting: Internal Medicine

## 2013-05-30 NOTE — Telephone Encounter (Signed)
Okay #60 x 3 

## 2013-06-11 ENCOUNTER — Encounter: Payer: Self-pay | Admitting: Internal Medicine

## 2013-06-17 ENCOUNTER — Encounter: Payer: Self-pay | Admitting: Internal Medicine

## 2013-06-17 ENCOUNTER — Ambulatory Visit (INDEPENDENT_AMBULATORY_CARE_PROVIDER_SITE_OTHER): Payer: Medicare Other | Admitting: Internal Medicine

## 2013-06-17 VITALS — BP 148/80 | HR 82 | Temp 98.0°F | Wt 210.0 lb

## 2013-06-17 DIAGNOSIS — D510 Vitamin B12 deficiency anemia due to intrinsic factor deficiency: Secondary | ICD-10-CM

## 2013-06-17 DIAGNOSIS — M1990 Unspecified osteoarthritis, unspecified site: Secondary | ICD-10-CM

## 2013-06-17 DIAGNOSIS — I10 Essential (primary) hypertension: Secondary | ICD-10-CM

## 2013-06-17 DIAGNOSIS — N189 Chronic kidney disease, unspecified: Secondary | ICD-10-CM

## 2013-06-17 DIAGNOSIS — K649 Unspecified hemorrhoids: Secondary | ICD-10-CM | POA: Insufficient documentation

## 2013-06-17 DIAGNOSIS — N184 Chronic kidney disease, stage 4 (severe): Secondary | ICD-10-CM | POA: Insufficient documentation

## 2013-06-17 DIAGNOSIS — F39 Unspecified mood [affective] disorder: Secondary | ICD-10-CM | POA: Insufficient documentation

## 2013-06-17 LAB — VITAMIN B12: Vitamin B-12: >1500 pg/mL — ABNORMAL HIGH (ref 211–911)

## 2013-06-17 LAB — BASIC METABOLIC PANEL
Chloride: 110 mEq/L (ref 96–112)
Creatinine, Ser: 2 mg/dL — ABNORMAL HIGH (ref 0.4–1.2)
GFR: 24.93 mL/min — ABNORMAL LOW (ref >60.00)
Potassium: 5.1 mEq/L (ref 3.5–5.1)
Sodium: 141 mEq/L (ref 135–145)

## 2013-06-17 MED ORDER — HYDROCORTISONE 2.5 % RE CREA: TOPICAL_CREAM | Freq: Two times a day (BID) | RECTAL | Status: AC

## 2013-06-17 NOTE — Assessment & Plan Note (Signed)
Off the lisinopril/HCTZ---feels worse---but may be due to knowing something is wrong Was CKD 3 last year---now to 4  Forgot to have her stop the naproxen--she will stop this now Will recheck creat---if not better now, probably not due to this (and can probably get trial back on ACEI) Will set up with nephrology

## 2013-06-17 NOTE — Assessment & Plan Note (Signed)
Recent benign colonoscopy Will try anusol hc

## 2013-06-17 NOTE — Patient Instructions (Signed)
Please stop the naproxen. Try acetaminophen extended release 650mg  three times daily.

## 2013-06-17 NOTE — Assessment & Plan Note (Signed)
Was off B12 when no IM available---took PO Now off x 3 weeks If level low, will restart shots

## 2013-06-17 NOTE — Assessment & Plan Note (Signed)
BP Readings from Last 3 Encounters:  06/17/13 148/80  05/27/13 140/70  11/11/12 150/80   Will defer med changes to nephrology at this point No changes needed emergently May need lasix for the mildly increased edema

## 2013-06-17 NOTE — Assessment & Plan Note (Signed)
Stress with ill family members  Lost niece recently Now worried about her health Not MDD but may want to consider Rx Will recheck soon

## 2013-06-17 NOTE — Progress Notes (Signed)
Subjective:    Patient ID: Bailey Ramirez, female    DOB: 12-Jan-1929, 79 y.o.   MRN: [id number]  HPI Her with daughter Deborah Chalk  Did stop the lisinopril/HCTZ Doesn't feel good since stopping it After 2 days, started feeling more weak Still does instrumental ADLs but "afraid to go anywhere"  Still taking the naproxen Asked her to stop this  Intermittent headache lately No chest pain Notes SOB with exertion and in the morning--no recent change Mild edema  Hemorrhoids have been bleeding No pain  Current Outpatient Prescriptions on File Prior to Visit  Medication Sig Dispense Refill  . albuterol (PROAIR HFA) 108 (90 BASE) MCG/ACT inhaler Inhale 2 puffs into the lungs 4 (four) times daily as needed.        Marland Kitchen amLODipine (NORVASC) 5 MG tablet TAKE 0.5-1 TABLETS (2.5-5 MG TOTAL) BY MOUTH DAILY.  90 tablet  3  . citalopram (CELEXA) 40 MG tablet TAKE 1 TABLET BY MOUTH EVERY DAY  90 tablet  0  . conjugated estrogens (PREMARIN) vaginal cream Place vaginally 3 (three) times a week.  42.5 g  0  . cyanocobalamin (,VITAMIN B-12,) 1000 MCG/ML injection Inject 1 mL (1,000 mcg total) into the muscle every 21 ( twenty-one) days.  30 mL  3  . hydrocortisone 2.5 % cream Apply 1 application topically 3 (three) times daily as needed.  60 g  1  . Multiple Vitamins-Minerals (MULTIVITAL) tablet Take 1 tablet by mouth daily.        Marland Kitchen nystatin (MYCOSTATIN) cream APPLY THREE TIMES A DAY AS NEEDED  30 g  1  . omeprazole (PRILOSEC) 40 MG capsule TAKE ONE CAPSULE BY MOUTH EVERY DAY  30 capsule  7   No current facility-administered medications on file prior to visit.    Allergies  Allergen Reactions  . Meloxicam     REACTION: leg swellling  . Promethazine Hcl     Intolerant, altered mental status  . Sulfonamide Derivatives     REACTION: rash    Past Medical History  Diagnosis Date  . Anxiety   . Depression   . GERD (gastroesophageal reflux disease)   . Hyperlipemia   . HTN (hypertension) 2003  .  Osteoarthritis     knees  . Lumbar spinal stenosis   . Pernicious anemia   . Asthma     Past Surgical History  Procedure Laterality Date  . Other surgical history  1999    Ruptured disc repair  . Abdominal hysterectomy  1984  . Tonsillectomy  as a child  . Total knee arthroplasty  9/11    Dr Rosita Kea    Family History  Problem Relation Age of Onset  . Lung cancer Mother   . Hypertension Sister   . Emphysema Father   . COPD Brother   . Other Sister     balance issues  . Coronary artery disease Neg Hx   . Cancer Neg Hx     no other cancer    History   Social History  . Marital Status: Widowed    Spouse Name: N/A    Number of Children: 2  . Years of Education: N/A   Occupational History  . Retired Print production planner    Social History Main Topics  . Smoking status: Former Smoker    Quit date: 08/20/1986  . Smokeless tobacco: Never Used     Comment: Quit 1988  . Alcohol Use: No  . Drug Use: Not on file  . Sexual Activity: Not on  file   Other Topics Concern  . Not on file   Social History Narrative   Widowed 12/09.       Has living will.    Health care POA is son, then daughter.    Has DNR order already   No feeding tube if cognitively unaware   Review of Systems Did try to go to an exercise class yesterday---really wore herself out    Objective:   Physical Exam  Constitutional: She appears well-developed and well-nourished. No distress.  Musculoskeletal: She exhibits edema.  1+ edema  Psychiatric: Her behavior is normal.  Seems somewhat depressed now          Assessment & Plan:

## 2013-06-17 NOTE — Assessment & Plan Note (Signed)
Will change to tylenol (no more naproxen)

## 2013-06-17 NOTE — Addendum Note (Signed)
Addended by: Tillman Abide I on: 06/17/2013 11:55 AM   Modules accepted: Orders

## 2013-06-20 ENCOUNTER — Inpatient Hospital Stay: Payer: Self-pay | Admitting: Internal Medicine

## 2013-06-20 LAB — TROPONIN I: Troponin-I: 0.85 ng/mL — ABNORMAL HIGH

## 2013-06-20 LAB — URINALYSIS, COMPLETE
Glucose,UR: NEGATIVE mg/dL (ref 0–75)
Ketone: NEGATIVE
Leukocyte Esterase: NEGATIVE
Nitrite: NEGATIVE
Ph: 5 (ref 4.5–8.0)
Specific Gravity: 1.015 (ref 1.003–1.030)
Squamous Epithelial: NONE SEEN
WBC UR: 4 /HPF (ref 0–5)

## 2013-06-20 LAB — COMPREHENSIVE METABOLIC PANEL
Albumin: 3.6 g/dL (ref 3.4–5.0)
Bilirubin,Total: 0.3 mg/dL (ref 0.2–1.0)
Chloride: 112 mmol/L — ABNORMAL HIGH (ref 98–107)
Creatinine: 1.74 mg/dL — ABNORMAL HIGH (ref 0.60–1.30)
EGFR (African American): 31 — ABNORMAL LOW
Glucose: 203 mg/dL — ABNORMAL HIGH (ref 65–99)
Osmolality: 293 (ref 275–301)
Potassium: 3.9 mmol/L (ref 3.5–5.1)
SGPT (ALT): 43 U/L (ref 12–78)

## 2013-06-20 LAB — CBC
MCHC: 32.8 g/dL (ref 32.0–36.0)
MCV: 87 fL (ref 80–100)
Platelet: 250 10*3/uL (ref 150–440)
RBC: 3.78 10*6/uL — ABNORMAL LOW (ref 3.80–5.20)
RDW: 13.8 % (ref 11.5–14.5)

## 2013-06-20 LAB — PROTIME-INR
INR: 1
Prothrombin Time: 13.7 secs (ref 11.5–14.7)

## 2013-06-20 LAB — CK TOTAL AND CKMB (NOT AT ARMC)
CK, Total: 114 U/L (ref 21–215)
CK-MB: 2.9 ng/mL (ref 0.5–3.6)

## 2013-06-20 LAB — PRO B NATRIURETIC PEPTIDE: B-Type Natriuretic Peptide: 2480 pg/mL — ABNORMAL HIGH (ref 0–450)

## 2013-06-21 LAB — CBC WITH DIFFERENTIAL/PLATELET
Eosinophil %: 3.6 %
HCT: 26.9 % — ABNORMAL LOW (ref 35.0–47.0)
HGB: 8.8 g/dL — ABNORMAL LOW (ref 12.0–16.0)
MCH: 28.1 pg (ref 26.0–34.0)
MCV: 86 fL (ref 80–100)
Monocyte #: 1.2 x10 3/mm — ABNORMAL HIGH (ref 0.2–0.9)
Monocyte %: 9.8 %
Neutrophil %: 67.4 %
Platelet: 183 10*3/uL (ref 150–440)
RDW: 13.5 % (ref 11.5–14.5)

## 2013-06-21 LAB — BASIC METABOLIC PANEL
BUN: 33 mg/dL — ABNORMAL HIGH (ref 7–18)
Chloride: 112 mmol/L — ABNORMAL HIGH (ref 98–107)
Co2: 22 mmol/L (ref 21–32)
Creatinine: 2.13 mg/dL — ABNORMAL HIGH (ref 0.60–1.30)
EGFR (Non-African Amer.): 21 — ABNORMAL LOW

## 2013-06-21 LAB — CK TOTAL AND CKMB (NOT AT ARMC)
CK, Total: 130 U/L (ref 21–215)
CK-MB: 6.4 ng/mL — ABNORMAL HIGH (ref 0.5–3.6)

## 2013-06-21 LAB — TROPONIN I: Troponin-I: 0.54 ng/mL — ABNORMAL HIGH

## 2013-06-21 LAB — HEMOGLOBIN A1C: Hemoglobin A1C: 5.4 % (ref 4.2–6.3)

## 2013-06-22 LAB — CBC WITH DIFFERENTIAL/PLATELET
Basophil #: 0.1 10*3/uL (ref 0.0–0.1)
Basophil %: 1 %
HCT: 25 % — ABNORMAL LOW (ref 35.0–47.0)
MCH: 28.6 pg (ref 26.0–34.0)
MCHC: 33.5 g/dL (ref 32.0–36.0)
MCV: 86 fL (ref 80–100)
Monocyte #: 1.2 x10 3/mm — ABNORMAL HIGH (ref 0.2–0.9)
Neutrophil #: 5.4 10*3/uL (ref 1.4–6.5)
Neutrophil %: 56.2 %
Platelet: 171 10*3/uL (ref 150–440)
RBC: 2.93 10*6/uL — ABNORMAL LOW (ref 3.80–5.20)
RDW: 13.9 % (ref 11.5–14.5)
WBC: 9.7 10*3/uL (ref 3.6–11.0)

## 2013-06-22 LAB — BASIC METABOLIC PANEL
BUN: 32 mg/dL — ABNORMAL HIGH (ref 7–18)
Co2: 24 mmol/L (ref 21–32)
Glucose: 101 mg/dL — ABNORMAL HIGH (ref 65–99)
Sodium: 138 mmol/L (ref 136–145)

## 2013-06-22 LAB — PROTEIN / CREATININE RATIO, URINE: Protein/Creat. Ratio: 449 mg/gCREAT — ABNORMAL HIGH (ref 0–200)

## 2013-06-23 LAB — UR PROT ELECTROPHORESIS, URINE RANDOM

## 2013-06-24 ENCOUNTER — Telehealth: Payer: Self-pay

## 2013-06-24 LAB — BASIC METABOLIC PANEL
Anion Gap: 6 — ABNORMAL LOW (ref 7–16)
Chloride: 106 mmol/L (ref 98–107)
Co2: 24 mmol/L (ref 21–32)
Creatinine: 2.2 mg/dL — ABNORMAL HIGH (ref 0.60–1.30)
EGFR (Non-African Amer.): 20 — ABNORMAL LOW
Glucose: 110 mg/dL — ABNORMAL HIGH (ref 65–99)
Osmolality: 284 (ref 275–301)
Sodium: 136 mmol/L (ref 136–145)

## 2013-06-24 LAB — CBC WITH DIFFERENTIAL/PLATELET
Basophil %: 1.3 %
Eosinophil #: 0.5 10*3/uL (ref 0.0–0.7)
Eosinophil %: 5.9 %
HCT: 24.2 % — ABNORMAL LOW (ref 35.0–47.0)
HGB: 8.3 g/dL — ABNORMAL LOW (ref 12.0–16.0)
Lymphocyte #: 2.2 10*3/uL (ref 1.0–3.6)
Lymphocyte %: 23.1 %
MCH: 29.1 pg (ref 26.0–34.0)
MCV: 84 fL (ref 80–100)
Monocyte #: 1.3 x10 3/mm — ABNORMAL HIGH (ref 0.2–0.9)
Neutrophil %: 55.5 %
Platelet: 166 10*3/uL (ref 150–440)
RBC: 2.86 10*6/uL — ABNORMAL LOW (ref 3.80–5.20)
RDW: 13.6 % (ref 11.5–14.5)
WBC: 9.3 10*3/uL (ref 3.6–11.0)

## 2013-06-24 NOTE — Telephone Encounter (Signed)
Patient contacted regarding discharge from [facility] on 06/24/13.  Lmom for pt to call back.

## 2013-06-25 LAB — CULTURE, BLOOD (SINGLE)

## 2013-06-25 NOTE — Telephone Encounter (Signed)
2nd attempt to contact pt regarding TCM.  Left message for pt to call back.

## 2013-06-25 NOTE — Telephone Encounter (Signed)
Daughter called back.  States that pt is under the care of [facility] and called yesterday to cancel her appt w/ our office.

## 2013-06-26 DIAGNOSIS — J45909 Unspecified asthma, uncomplicated: Secondary | ICD-10-CM

## 2013-06-26 DIAGNOSIS — M159 Polyosteoarthritis, unspecified: Secondary | ICD-10-CM

## 2013-06-26 DIAGNOSIS — N183 Chronic kidney disease, stage 3 (moderate): Secondary | ICD-10-CM

## 2013-06-26 DIAGNOSIS — I5031 Acute diastolic (congestive) heart failure: Secondary | ICD-10-CM

## 2013-07-01 ENCOUNTER — Encounter: Payer: Medicare Other | Admitting: Cardiovascular Disease

## 2013-07-07 ENCOUNTER — Ambulatory Visit: Payer: Medicare Other | Admitting: Internal Medicine

## 2013-07-20 ENCOUNTER — Ambulatory Visit: Payer: Medicare Other | Admitting: Internal Medicine

## 2013-08-10 ENCOUNTER — Encounter: Payer: Self-pay | Admitting: Internal Medicine

## 2013-08-10 ENCOUNTER — Ambulatory Visit (INDEPENDENT_AMBULATORY_CARE_PROVIDER_SITE_OTHER): Payer: Medicare Other | Admitting: Internal Medicine

## 2013-08-10 VITALS — BP 140/80 | HR 60 | Temp 98.0°F | Wt 191.0 lb

## 2013-08-10 DIAGNOSIS — F39 Unspecified mood [affective] disorder: Secondary | ICD-10-CM

## 2013-08-10 DIAGNOSIS — N184 Chronic kidney disease, stage 4 (severe): Secondary | ICD-10-CM

## 2013-08-10 DIAGNOSIS — M48061 Spinal stenosis, lumbar region without neurogenic claudication: Secondary | ICD-10-CM

## 2013-08-10 DIAGNOSIS — I5032 Chronic diastolic (congestive) heart failure: Secondary | ICD-10-CM

## 2013-08-10 MED ORDER — CARVEDILOL 6.25 MG PO TABS: 6.2500 mg | ORAL_TABLET | Freq: Two times a day (BID) | ORAL | Status: AC

## 2013-08-10 NOTE — Patient Instructions (Signed)
Please meet with Casimiro Needle to develop a personalized program to improve your strength and balance.

## 2013-08-10 NOTE — Assessment & Plan Note (Signed)
Mood has been better since back home with some stability

## 2013-08-10 NOTE — Progress Notes (Signed)
Pre-visit discussion using our clinic review tool. No additional management support is needed unless otherwise documented below in the visit note.  

## 2013-08-10 NOTE — Assessment & Plan Note (Signed)
Doing okay off the systemic NSAIDs Needs to work on fitness and balance

## 2013-08-10 NOTE — Assessment & Plan Note (Signed)
Doing much better No evidence of coronary disease Weight is way down with diuresis  Doesn't seem that she needs the upcoming stress test With her renal function, would not want her to get dye from cath even if something suspicious

## 2013-08-10 NOTE — Assessment & Plan Note (Signed)
Seems to have stabilized with creatinine ~2 Sees Dr Wynelle Link

## 2013-08-10 NOTE — Progress Notes (Signed)
Subjective:    Patient ID: Bailey Ramirez, female    DOB: [birth year], 79 y.o.   MRN: [id number]  HPI Here with daughter  Doing very well, she thinks Not back to her previous baseline Has to use rollator now--not cane Cooks a little--orders in food Hasn't been driving of late Independent with ADLs Cancelled aide---will be trying to get someone else just for housekeeping  No chest pain Breathing is better Not wheezing Weight is down 20# since before hospital--- fluid Stress test scheduled now in January  Still with arthritis pain Using tylenol and topical diclofenac (only occasional)  Reviewed Dr Alice Rieger notes Kidney function now stable at a lower level  Current Outpatient Prescriptions on File Prior to Visit  Medication Sig Dispense Refill  . acetaminophen (TYLENOL) 650 MG CR tablet Take 650 mg by mouth 3 (three) times daily.      Marland Kitchen albuterol (PROAIR HFA) 108 (90 BASE) MCG/ACT inhaler Inhale 2 puffs into the lungs 4 (four) times daily as needed.        . citalopram (CELEXA) 40 MG tablet TAKE 1 TABLET BY MOUTH EVERY DAY  90 tablet  0  . conjugated estrogens (PREMARIN) vaginal cream Place vaginally 3 (three) times a week.  42.5 g  0  . hydrocortisone (PROCTOZONE-HC) 2.5 % rectal cream Place rectally 2 (two) times daily.  30 g  1  . hydrocortisone 2.5 % cream Apply 1 application topically 3 (three) times daily as needed.  60 g  1  . Multiple Vitamins-Minerals (MULTIVITAL) tablet Take 1 tablet by mouth daily.        Marland Kitchen nystatin (MYCOSTATIN) cream APPLY THREE TIMES A DAY AS NEEDED  30 g  1  . omeprazole (PRILOSEC) 40 MG capsule TAKE ONE CAPSULE BY MOUTH EVERY DAY  30 capsule  7   No current facility-administered medications on file prior to visit.    Allergies  Allergen Reactions  . Meloxicam     REACTION: leg swellling  . Promethazine Hcl     Intolerant, altered mental status  . Sulfonamide Derivatives     REACTION: rash    Past Medical History  Diagnosis Date  .  Anxiety   . Depression   . GERD (gastroesophageal reflux disease)   . Hyperlipemia   . HTN (hypertension) 2003  . Osteoarthritis     knees  . Lumbar spinal stenosis   . Pernicious anemia   . Asthma   . Chronic diastolic heart failure     Past Surgical History  Procedure Laterality Date  . Other surgical history  1999    Ruptured disc repair  . Abdominal hysterectomy  1984  . Tonsillectomy  as a child  . Total knee arthroplasty  9/11    Dr Rosita Kea    Family History  Problem Relation Age of Onset  . Lung cancer Mother   . Hypertension Sister   . Emphysema Father   . COPD Brother   . Other Sister     balance issues  . Coronary artery disease Neg Hx   . Cancer Neg Hx     no other cancer    History   Social History  . Marital Status: Widowed    Spouse Name: N/A    Number of Children: 2  . Years of Education: N/A   Occupational History  . Retired Print production planner    Social History Main Topics  . Smoking status: Former Smoker    Quit date: 08/20/1986  . Smokeless tobacco: Never  Used     Comment: Quit [id number]  . Alcohol Use: No  . Drug Use: Not on file  . Sexual Activity: Not on file   Other Topics Concern  . Not on file   Social History Narrative   Widowed 12/09.       Has living will.    Health care POA is son, then daughter.    Has DNR order already   No feeding tube if cognitively unaware   Review of Systems Appetite is okay Generally sleeps okay Bowels are fine    Objective:   Physical Exam  Constitutional: She appears well-developed and well-nourished. No distress.  Neck: Normal range of motion. Neck supple. No thyromegaly present.  Cardiovascular: Normal rate and normal heart sounds.  Exam reveals no gallop.   No murmur heard. Pulmonary/Chest: Effort normal and breath sounds normal. No respiratory distress. She has no wheezes. She has no rales.  Abdominal: Soft. There is no tenderness.  Musculoskeletal: She exhibits no edema and no tenderness.    Lymphadenopathy:    She has no cervical adenopathy.  Psychiatric: She has a normal mood and affect. Her behavior is normal.          Assessment & Plan:

## 2013-08-14 ENCOUNTER — Other Ambulatory Visit: Payer: Self-pay | Admitting: Internal Medicine

## 2013-08-18 ENCOUNTER — Encounter: Payer: Self-pay | Admitting: Internal Medicine

## 2013-08-20 DIAGNOSIS — H34239 Retinal artery branch occlusion, unspecified eye: Secondary | ICD-10-CM

## 2013-08-20 HISTORY — DX: Retinal artery branch occlusion, unspecified eye: H34.239

## 2013-08-21 ENCOUNTER — Ambulatory Visit: Payer: Self-pay | Admitting: Oncology

## 2013-08-21 LAB — CBC CANCER CENTER
Basophil #: 0.2 x10 3/mm — ABNORMAL HIGH (ref 0.0–0.1)
Basophil %: 1.5 %
EOS ABS: 0.4 x10 3/mm (ref 0.0–0.7)
Eosinophil %: 4.3 %
HCT: 29.4 % — ABNORMAL LOW (ref 35.0–47.0)
HGB: 9.4 g/dL — AB (ref 12.0–16.0)
LYMPHS ABS: 1.7 x10 3/mm (ref 1.0–3.6)
LYMPHS PCT: 16.3 %
MCH: 27.4 pg (ref 26.0–34.0)
MCHC: 31.9 g/dL — ABNORMAL LOW (ref 32.0–36.0)
MCV: 86 fL (ref 80–100)
Monocyte #: 1 x10 3/mm — ABNORMAL HIGH (ref 0.2–0.9)
Monocyte %: 10.1 %
NEUTROS PCT: 67.8 %
Neutrophil #: 7 x10 3/mm — ABNORMAL HIGH (ref 1.4–6.5)
Platelet: 198 x10 3/mm (ref 150–440)
RBC: 3.42 10*6/uL — AB (ref 3.80–5.20)
RDW: 14.1 % (ref 11.5–14.5)
WBC: 10.4 x10 3/mm (ref 3.6–11.0)

## 2013-08-21 LAB — IRON AND TIBC
IRON BIND. CAP.(TOTAL): 315 ug/dL (ref 250–450)
IRON SATURATION: 10 %
IRON: 30 ug/dL — AB (ref 50–170)
UNBOUND IRON-BIND. CAP.: 285 ug/dL

## 2013-08-21 LAB — FOLATE: Folic Acid: 29.5 ng/mL (ref 3.1–100.0)

## 2013-08-21 LAB — LACTATE DEHYDROGENASE: LDH: 209 U/L (ref 81–246)

## 2013-08-21 LAB — FERRITIN: Ferritin (ARMC): 206 ng/mL (ref 8–388)

## 2013-08-25 ENCOUNTER — Ambulatory Visit: Payer: Self-pay | Admitting: Ophthalmology

## 2013-09-07 ENCOUNTER — Encounter: Payer: Self-pay | Admitting: Internal Medicine

## 2013-09-07 NOTE — Telephone Encounter (Signed)
PC with daughter after MyChart contact  Noted hemi retinal artery occlusion on left Reviewed Dr Daisy BlossomPotter's notes Apparently the carotid ultrasound showed "69%" blockage Just called about getting 30 day atrial fib monitor--I told them to proceed with this (unless this is implanted)   Lyla Sonarrie, Please try to get a copy of the carotid ultrasound that Dr Champ MungoAppenzeller ordered

## 2013-09-10 ENCOUNTER — Other Ambulatory Visit: Payer: Self-pay | Admitting: Internal Medicine

## 2013-09-20 ENCOUNTER — Ambulatory Visit: Payer: Self-pay | Admitting: Oncology

## 2013-09-22 LAB — CANCER CENTER HEMOGLOBIN: HGB: 10.7 g/dL — ABNORMAL LOW (ref 12.0–16.0)

## 2013-09-25 ENCOUNTER — Encounter: Payer: Self-pay | Admitting: Internal Medicine

## 2013-10-04 ENCOUNTER — Other Ambulatory Visit: Payer: Self-pay | Admitting: Internal Medicine

## 2013-10-18 ENCOUNTER — Ambulatory Visit: Payer: Self-pay | Admitting: Oncology

## 2013-10-19 ENCOUNTER — Other Ambulatory Visit: Payer: Self-pay | Admitting: Internal Medicine

## 2013-10-22 LAB — CANCER CENTER HEMOGLOBIN: HGB: 10.6 g/dL — ABNORMAL LOW (ref 12.0–16.0)

## 2013-11-09 ENCOUNTER — Encounter: Payer: Self-pay | Admitting: Internal Medicine

## 2013-11-09 ENCOUNTER — Ambulatory Visit (INDEPENDENT_AMBULATORY_CARE_PROVIDER_SITE_OTHER): Payer: Medicare HMO | Admitting: Internal Medicine

## 2013-11-09 VITALS — BP 130/70 | HR 57 | Temp 98.0°F | Wt 186.0 lb

## 2013-11-09 DIAGNOSIS — N039 Chronic nephritic syndrome with unspecified morphologic changes: Secondary | ICD-10-CM

## 2013-11-09 DIAGNOSIS — F39 Unspecified mood [affective] disorder: Secondary | ICD-10-CM

## 2013-11-09 DIAGNOSIS — D631 Anemia in chronic kidney disease: Secondary | ICD-10-CM | POA: Insufficient documentation

## 2013-11-09 DIAGNOSIS — M48061 Spinal stenosis, lumbar region without neurogenic claudication: Secondary | ICD-10-CM

## 2013-11-09 DIAGNOSIS — I5032 Chronic diastolic (congestive) heart failure: Secondary | ICD-10-CM

## 2013-11-09 DIAGNOSIS — N184 Chronic kidney disease, stage 4 (severe): Secondary | ICD-10-CM

## 2013-11-09 DIAGNOSIS — N189 Chronic kidney disease, unspecified: Secondary | ICD-10-CM

## 2013-11-09 NOTE — Assessment & Plan Note (Signed)
Mood is stable Will continue the citalopram

## 2013-11-09 NOTE — Progress Notes (Signed)
Subjective:    Patient ID: Bailey KingfisherJane Ramirez, female    DOB: 02-13-1929, 80 y.o.   MRN: [id number]  HPI Here with daughter Doing "very well" Daughter feels she is "satisfactory" "I have a new pain every day"  Did see Dr Wynelle LinkKolluru a couple of weeks ago Renal function fairly stable---still with proteinuria Initial dialysis discussions  No apparent heart trouble Walks in house with rollator---does have DOE even then Does laundry and light work in [employer] No change in exercise tolerance--but very limited No chest pain No palpitations Weight is down 5# since last visit No edema  Bad pain persists Uses acetaminophen Dr Wynelle LinkKolluru gave her gabapentin for her new pain--after weighing in the morning will have terrible left hip pain radiating to the back No change with the gabapentin  Mood generally okay  Current Outpatient Prescriptions on File Prior to Visit  Medication Sig Dispense Refill  . acetaminophen (TYLENOL) 650 MG CR tablet Take 650 mg by mouth 3 (three) times daily.      . carvedilol (COREG) 6.25 MG tablet Take 1 tablet (6.25 mg total) by mouth 2 (two) times daily with a meal.  180 tablet  3  . citalopram (CELEXA) 40 MG tablet TAKE 1 TABLET EVERY MORNING  30 tablet  0  . conjugated estrogens (PREMARIN) vaginal cream Place vaginally 3 (three) times a week.  42.5 g  0  . furosemide (LASIX) 20 MG tablet Take 20 mg by mouth 2 (two) times daily.       . hydrocortisone (PROCTOZONE-HC) 2.5 % rectal cream Place rectally 2 (two) times daily.  30 g  1  . Multiple Vitamins-Minerals (MULTIVITAL) tablet Take 1 tablet by mouth daily.        Marland Kitchen. nystatin (MYCOSTATIN) cream APPLY THREE TIMES A DAY AS NEEDED  30 g  1  . omeprazole (PRILOSEC) 40 MG capsule TAKE ONE CAPSULE BY MOUTH EVERY DAY  30 capsule  7  . PROVENTIL HFA 108 (90 BASE) MCG/ACT inhaler USE 2 PUFFS 4 TIMES A DAY AS NEEDED FOR SHORTNESS OF BREATH OR WHEEZE  6.7 each  0  . VOLTAREN 1 % GEL Apply 2 g topically 2 (two) times daily as needed  (pain).        No current facility-administered medications on file prior to visit.    Allergies  Allergen Reactions  . Meloxicam     REACTION: leg swellling  . Promethazine Hcl     Intolerant, altered mental status  . Sulfonamide Derivatives     REACTION: rash    Past Medical History  Diagnosis Date  . Anxiety   . Depression   . GERD (gastroesophageal reflux disease)   . Hyperlipemia   . HTN (hypertension) 2003  . Osteoarthritis     knees  . Lumbar spinal stenosis   . Pernicious anemia   . Asthma   . Chronic diastolic heart failure   . Retinal artery occlusion, branch 1/15    left    Past Surgical History  Procedure Laterality Date  . Other surgical history  1999    Ruptured disc repair  . Abdominal hysterectomy  1984  . Tonsillectomy  as a child  . Total knee arthroplasty  9/11    Dr Rosita KeaMenz    Family History  Problem Relation Age of Onset  . Lung cancer Mother   . Hypertension Sister   . Emphysema Father   . COPD Brother   . Other Sister     balance issues  .  Coronary artery disease Neg Hx   . Cancer Neg Hx     no other cancer    History   Social History  . Marital Status: Widowed    Spouse Name: N/A    Number of Children: 2  . Years of Education: N/A   Occupational History  . Retired Print production planner    Social History Main Topics  . Smoking status: Former Smoker    Quit date: 08/20/1986  . Smokeless tobacco: Never Used     Comment: Quit 1988  . Alcohol Use: No  . Drug Use: Not on file  . Sexual Activity: Not on file   Other Topics Concern  . Not on file   Social History Narrative   Widowed 12/09.       Has living will.    Health care POA is son, then daughter.    Has DNR order already   No feeding tube if cognitively unaware   Review of Systems Eats fairly well Sleeps well-- sleeps flat. No PND Clot in left eye broke up---vision is much better again     Objective:   Physical Exam  Constitutional: She appears well-developed  and well-nourished. No distress.  Neck: Normal range of motion. Neck supple.  Cardiovascular: Normal rate, regular rhythm and normal heart sounds.  Exam reveals no gallop.   No murmur heard. Pulmonary/Chest: Effort normal and breath sounds normal. No respiratory distress. She has no wheezes. She has no rales.  Abdominal: Soft. There is no tenderness.  Musculoskeletal: She exhibits no edema.  Lymphadenopathy:    She has no cervical adenopathy.  Psychiatric: She has a normal mood and affect. Her behavior is normal.          Assessment & Plan:

## 2013-11-09 NOTE — Assessment & Plan Note (Signed)
Continues with Dr Wynelle LinkKolluru

## 2013-11-09 NOTE — Assessment & Plan Note (Signed)
Hgb has been stable Has seen Dr Orlie DakinFinnegan Did have EPO once

## 2013-11-09 NOTE — Progress Notes (Signed)
Pre visit review using our clinic review tool, if applicable. No additional management support is needed unless otherwise documented below in the visit note. 

## 2013-11-09 NOTE — Assessment & Plan Note (Signed)
Stable Weight down some NYHA class 3a but stable No changes needed

## 2013-11-09 NOTE — Assessment & Plan Note (Signed)
Probably the cause of the pain in left side Will try increasing the gabapentin

## 2013-11-10 DIAGNOSIS — Z0279 Encounter for issue of other medical certificate: Secondary | ICD-10-CM

## 2013-11-15 ENCOUNTER — Encounter: Payer: Self-pay | Admitting: Internal Medicine

## 2013-11-16 MED ORDER — GABAPENTIN 100 MG PO CAPS
200.0000 mg | ORAL_CAPSULE | Freq: Every day | ORAL | Status: DC
Start: 2013-11-16 — End: 2014-01-04

## 2013-11-18 ENCOUNTER — Ambulatory Visit: Payer: Self-pay | Admitting: Oncology

## 2013-11-19 LAB — CBC CANCER CENTER
Basophil #: 0.1 x10 3/mm (ref 0.0–0.1)
Basophil %: 1.8 %
EOS PCT: 7.4 %
Eosinophil #: 0.6 x10 3/mm (ref 0.0–0.7)
HCT: 30.9 % — AB (ref 35.0–47.0)
HGB: 9.8 g/dL — ABNORMAL LOW (ref 12.0–16.0)
LYMPHS PCT: 23.1 %
Lymphocyte #: 1.9 x10 3/mm (ref 1.0–3.6)
MCH: 27.4 pg (ref 26.0–34.0)
MCHC: 31.7 g/dL — ABNORMAL LOW (ref 32.0–36.0)
MCV: 86 fL (ref 80–100)
Monocyte #: 0.7 x10 3/mm (ref 0.2–0.9)
Monocyte %: 8.6 %
NEUTROS ABS: 4.9 x10 3/mm (ref 1.4–6.5)
Neutrophil %: 59.1 %
PLATELETS: 187 x10 3/mm (ref 150–440)
RBC: 3.59 10*6/uL — AB (ref 3.80–5.20)
RDW: 14.4 % (ref 11.5–14.5)
WBC: 8.3 x10 3/mm (ref 3.6–11.0)

## 2013-11-19 LAB — IRON AND TIBC
IRON SATURATION: 25 %
IRON: 81 ug/dL (ref 50–170)
Iron Bind.Cap.(Total): 329 ug/dL (ref 250–450)
Unbound Iron-Bind.Cap.: 248 ug/dL

## 2013-11-19 LAB — FERRITIN: Ferritin (ARMC): 170 ng/mL (ref 8–388)

## 2013-11-23 ENCOUNTER — Other Ambulatory Visit: Payer: Self-pay | Admitting: Internal Medicine

## 2013-11-24 ENCOUNTER — Other Ambulatory Visit: Payer: Self-pay | Admitting: Internal Medicine

## 2013-11-25 ENCOUNTER — Ambulatory Visit: Payer: Medicare Other | Admitting: Internal Medicine

## 2013-12-18 ENCOUNTER — Ambulatory Visit: Payer: Self-pay | Admitting: Oncology

## 2013-12-31 ENCOUNTER — Emergency Department: Payer: Self-pay | Admitting: Emergency Medicine

## 2013-12-31 LAB — URINALYSIS, COMPLETE
Bilirubin,UR: NEGATIVE
Blood: NEGATIVE
Glucose,UR: NEGATIVE mg/dL (ref 0–75)
KETONE: NEGATIVE
Nitrite: NEGATIVE
Ph: 5 (ref 4.5–8.0)
Protein: 100
RBC,UR: 4 /HPF (ref 0–5)
SPECIFIC GRAVITY: 1.012 (ref 1.003–1.030)
Squamous Epithelial: 8
WBC UR: 9 /HPF (ref 0–5)

## 2013-12-31 LAB — CBC
HCT: 30.8 % — ABNORMAL LOW (ref 35.0–47.0)
HGB: 10.1 g/dL — ABNORMAL LOW (ref 12.0–16.0)
MCH: 28.6 pg (ref 26.0–34.0)
MCHC: 32.8 g/dL (ref 32.0–36.0)
MCV: 87 fL (ref 80–100)
Platelet: 153 10*3/uL (ref 150–440)
RBC: 3.53 10*6/uL — AB (ref 3.80–5.20)
RDW: 14.1 % (ref 11.5–14.5)
WBC: 10.1 10*3/uL (ref 3.6–11.0)

## 2013-12-31 LAB — COMPREHENSIVE METABOLIC PANEL
ALK PHOS: 55 U/L
ALT: 13 U/L (ref 12–78)
ANION GAP: 6 — AB (ref 7–16)
AST: 68 U/L — AB (ref 15–37)
Albumin: 3.6 g/dL (ref 3.4–5.0)
BUN: 53 mg/dL — AB (ref 7–18)
Bilirubin,Total: 0.3 mg/dL (ref 0.2–1.0)
Calcium, Total: 8.5 mg/dL (ref 8.5–10.1)
Chloride: 108 mmol/L — ABNORMAL HIGH (ref 98–107)
Co2: 24 mmol/L (ref 21–32)
Creatinine: 2.08 mg/dL — ABNORMAL HIGH (ref 0.60–1.30)
EGFR (African American): 25 — ABNORMAL LOW
EGFR (Non-African Amer.): 22 — ABNORMAL LOW
Glucose: 106 mg/dL — ABNORMAL HIGH (ref 65–99)
Osmolality: 290 (ref 275–301)
Potassium: 4.2 mmol/L (ref 3.5–5.1)
SODIUM: 138 mmol/L (ref 136–145)
Total Protein: 7 g/dL (ref 6.4–8.2)

## 2013-12-31 LAB — TROPONIN I: Troponin-I: <0.02 ng/mL

## 2013-12-31 LAB — PRO B NATRIURETIC PEPTIDE: B-Type Natriuretic Peptide: 3442 pg/mL — ABNORMAL HIGH (ref 0–450)

## 2014-01-01 ENCOUNTER — Encounter: Payer: Self-pay | Admitting: Internal Medicine

## 2014-01-01 MED ORDER — HYDROCODONE-ACETAMINOPHEN 5-325 MG PO TABS
0.5000 | ORAL_TABLET | Freq: Four times a day (QID) | ORAL | Status: AC | PRN
Start: 2014-01-01 — End: ?

## 2014-01-04 ENCOUNTER — Encounter: Payer: Self-pay | Admitting: Internal Medicine

## 2014-01-04 ENCOUNTER — Ambulatory Visit (INDEPENDENT_AMBULATORY_CARE_PROVIDER_SITE_OTHER): Payer: Commercial Managed Care - HMO | Admitting: Internal Medicine

## 2014-01-04 VITALS — BP 148/80 | HR 67 | Temp 98.0°F | Wt 189.0 lb

## 2014-01-04 DIAGNOSIS — S1093XA Contusion of unspecified part of neck, initial encounter: Secondary | ICD-10-CM

## 2014-01-04 DIAGNOSIS — M549 Dorsalgia, unspecified: Secondary | ICD-10-CM

## 2014-01-04 DIAGNOSIS — S0003XA Contusion of scalp, initial encounter: Secondary | ICD-10-CM | POA: Insufficient documentation

## 2014-01-04 DIAGNOSIS — S0083XA Contusion of other part of head, initial encounter: Secondary | ICD-10-CM

## 2014-01-04 NOTE — Assessment & Plan Note (Addendum)
Parieto-occipital CT was negative No focal neuro findings reassured

## 2014-01-04 NOTE — Progress Notes (Signed)
Subjective:    Patient ID: Bailey KingfisherJane Oloughlin, female    DOB: 02-02-1929, [age] y.o.   MRN: [id number]  [place] 4 days ago[place] [employer] [place] gotten tired and knew she had to leave---was heading to front of store to pay Didn't go with daughter --not her usual Fell flat on back trying to reach the seat Big goose egg on back of head---EMS called [facility] records requested--- CT head and back x-rays apparently were okay  Hydrocodone has helped her sleep but not much during the day Head still feels different She seems to be fearful now  [place] independent at her villa Prepares simple breakfast and lunch Daughter gives her prepared food Daughter does laundry Independent with all ADLs-- before the fall  Current Outpatient Prescriptions on File Prior to Visit  Medication Sig Dispense Refill  . acetaminophen (TYLENOL) 650 MG CR tablet Take 650 mg by mouth 3 (three) times daily.      . carvedilol (COREG) 6.25 MG tablet Take 1 tablet (6.25 mg total) by mouth 2 (two) times daily with a meal.  180 tablet  3  . citalopram (CELEXA) 40 MG tablet TAKE 1 TABLET EVERY MORNING  30 tablet  0  . clopidogrel (PLAVIX) 75 MG tablet Take 75 mg by mouth once.       . conjugated estrogens (PREMARIN) vaginal cream Place vaginally 3 (three) times a week.  42.5 g  0  . furosemide (LASIX) 20 MG tablet Take 20 mg by mouth 2 (two) times daily.       Marland Kitchen. HYDROcodone-acetaminophen (NORCO/VICODIN) 5-325 MG per tablet Take 0.5-1 tablets by mouth every 6 (six) hours as needed for moderate pain.  30 tablet  0  . hydrocortisone (PROCTOZONE-HC) 2.5 % rectal cream Place rectally 2 (two) times daily.  30 g  1  . Multiple Vitamins-Minerals (MULTIVITAL) tablet Take 1 tablet by mouth daily.        Marland Kitchen. nystatin (MYCOSTATIN) cream APPLY THREE TIMES A DAY AS NEEDED  30 g  1  . omeprazole (PRILOSEC) 40 MG capsule TAKE ONE CAPSULE BY MOUTH EVERY DAY  30 capsule  7  . PROVENTIL HFA 108 (90 BASE) MCG/ACT inhaler USE 2 PUFFS 4 TIMES A  DAY AS NEEDED FOR SHORTNESS OF BREATH OR WHEEZE  6.7 each  0  . VOLTAREN 1 % GEL Apply 2 g topically 2 (two) times daily as needed (pain).        No current facility-administered medications on file prior to visit.    Allergies  Allergen Reactions  . Meloxicam     REACTION: leg swellling  . Promethazine Hcl     Intolerant, altered mental status  . Sulfonamide Derivatives     REACTION: rash    Past Medical History  Diagnosis Date  . Anxiety   . Depression   . GERD (gastroesophageal reflux disease)   . Hyperlipemia   . HTN (hypertension) 2003  . Osteoarthritis     knees  . Lumbar spinal stenosis   . Pernicious anemia   . Asthma   . Chronic diastolic heart failure   . Retinal artery occlusion, branch 1/15    left    Past Surgical History  Procedure Laterality Date  . Other surgical history  1999    Ruptured disc repair  . Abdominal hysterectomy  1984  . Tonsillectomy  as a child  . Total knee arthroplasty  9/11    Dr Rosita KeaMenz    Family History  Problem  Relation Age of Onset  . Lung cancer Mother   . Hypertension Sister   . Emphysema Father   . Other Sister     balance issues  . Coronary artery disease Neg Hx   . Cancer Neg Hx     no other cancer  . COPD Brother     History   Social History  . Marital Status: Widowed    Spouse Name: N/A    Number of Children: 2  . Years of Education: N/A   Occupational History  . Retired Print production planneroffice manager    Social History Main Topics  . Smoking status: Former Smoker    Quit date: 08/20/1986  . Smokeless tobacco: Never Used     Comment: Quit 1988  . Alcohol Use: No  . Drug Use: Not on file  . Sexual Activity: Not on file   Other Topics Concern  . Not on file   Social History Narrative   Widowed 12/09.       Has living will.    Health care POA is son, then daughter.    Has DNR order already   No feeding tube if cognitively unaware   Review of Systems Appetite is okay Sleep is variable---usually okay No  unilateral vision loss. Some sort of diplopia but no clear change    Objective:   Physical Exam  Constitutional: She appears well-developed and well-nourished. No distress.  HENT:  Mouth/Throat: Oropharynx is clear and moist. No oropharyngeal exudate.  Mild bump on right parietal scalp Minor tenderness there  Eyes: EOM are normal. Pupils are equal, round, and reactive to light.  Musculoskeletal:  No spine tenderness Mild lateral back tenderness on both sides---over lower ribs  Neurological:  Trouble getting out of chair Slow but stable with rollator          Assessment & Plan:

## 2014-01-04 NOTE — Assessment & Plan Note (Signed)
Given the mechanism of the fall, she probably has bruising there also Has hydrocodone for prn at night May consider PT eval if not improving more in the next few days  Discussed considering Assisted living due to her increased care needs Daughter will make an appt to take her there

## 2014-01-08 ENCOUNTER — Encounter: Payer: Self-pay | Admitting: Internal Medicine

## 2014-01-08 ENCOUNTER — Ambulatory Visit (INDEPENDENT_AMBULATORY_CARE_PROVIDER_SITE_OTHER): Payer: Commercial Managed Care - HMO | Admitting: Internal Medicine

## 2014-01-08 VITALS — BP 218/88 | HR 65 | Temp 97.5°F | Wt 185.0 lb

## 2014-01-08 DIAGNOSIS — I10 Essential (primary) hypertension: Secondary | ICD-10-CM

## 2014-01-08 MED ORDER — CLONIDINE HCL 0.1 MG PO TABS: 0.1000 mg | ORAL_TABLET | Freq: Once | ORAL | Status: DC

## 2014-01-08 MED ORDER — CLONIDINE HCL 0.1 MG PO TABS
0.1000 mg | ORAL_TABLET | Freq: Once | ORAL | Status: AC
  Administered 2014-01-08: 0.1 mg via ORAL

## 2014-01-08 NOTE — Progress Notes (Signed)
Pre visit review using our clinic review tool, if applicable. No additional management support is needed unless otherwise documented below in the visit note. 

## 2014-01-08 NOTE — Progress Notes (Signed)
Subjective:    Patient ID: Bailey Ramirez, female    DOB: 1929/03/06, 80 y.o.   MRN: [id number]  HPI  Pt presents to the clinic today to for elevated blood pressure. She was at Dr. Malvin Johns office and her blood pressure was 217 systolic. She is on coreg and lasix. She has not missed her medications. She denies pain, chest pain or shortness of breath. She did experience a fall 1 week ago. She did reports dizziness, some blurred vision, and overall just not feeling right. She did follow up with Dr. Alphonsus Sias after the fall. Her head CT was negative.  Review of Systems      Past Medical History  Diagnosis Date  . Anxiety   . Depression   . GERD (gastroesophageal reflux disease)   . Hyperlipemia   . HTN (hypertension) 2003  . Osteoarthritis     knees  . Lumbar spinal stenosis   . Pernicious anemia   . Asthma   . Chronic diastolic heart failure   . Retinal artery occlusion, branch 1/15    left    Current Outpatient Prescriptions  Medication Sig Dispense Refill  . acetaminophen (TYLENOL) 650 MG CR tablet Take 650 mg by mouth 3 (three) times daily.      . carvedilol (COREG) 6.25 MG tablet Take 1 tablet (6.25 mg total) by mouth 2 (two) times daily with a meal.  180 tablet  3  . citalopram (CELEXA) 40 MG tablet TAKE 1 TABLET EVERY MORNING  30 tablet  0  . clopidogrel (PLAVIX) 75 MG tablet Take 75 mg by mouth once.       . conjugated estrogens (PREMARIN) vaginal cream Place vaginally 3 (three) times a week.  42.5 g  0  . furosemide (LASIX) 20 MG tablet Take 20 mg by mouth 2 (two) times daily.       Marland Kitchen HYDROcodone-acetaminophen (NORCO/VICODIN) 5-325 MG per tablet Take 0.5-1 tablets by mouth every 6 (six) hours as needed for moderate pain.  30 tablet  0  . hydrocortisone (PROCTOZONE-HC) 2.5 % rectal cream Place rectally 2 (two) times daily.  30 g  1  . Multiple Vitamins-Minerals (MULTIVITAL) tablet Take 1 tablet by mouth daily.        Marland Kitchen nystatin (MYCOSTATIN) cream APPLY THREE TIMES A DAY AS  NEEDED  30 g  1  . omeprazole (PRILOSEC) 40 MG capsule TAKE ONE CAPSULE BY MOUTH EVERY DAY  30 capsule  7  . PROVENTIL HFA 108 (90 BASE) MCG/ACT inhaler USE 2 PUFFS 4 TIMES A DAY AS NEEDED FOR SHORTNESS OF BREATH OR WHEEZE  6.7 each  0  . VOLTAREN 1 % GEL Apply 2 g topically 2 (two) times daily as needed (pain).        No current facility-administered medications for this visit.    Allergies  Allergen Reactions  . Meloxicam     REACTION: leg swellling  . Promethazine Hcl     Intolerant, altered mental status  . Sulfonamide Derivatives     REACTION: rash    Family History  Problem Relation Age of Onset  . Lung cancer Mother   . Hypertension Sister   . Emphysema Father   . Other Sister     balance issues  . Coronary artery disease Neg Hx   . Cancer Neg Hx     no other cancer  . COPD Brother     History   Social History  . Marital Status: Widowed    Spouse Name: N/A  Number of Children: 2  . Years of Education: N/A   Occupational History  . Retired Print production planner    Social History Main Topics  . Smoking status: Former Smoker    Quit date: 08/20/1986  . Smokeless tobacco: Never Used     Comment: Quit 1988  . Alcohol Use: No  . Drug Use: Not on file  . Sexual Activity: Not on file   Other Topics Concern  . Not on file   Social History Narrative   Widowed 12/09.       Has living will.    Health care POA is son, then daughter.    Has DNR order already   No feeding tube if cognitively unaware     Constitutional: Pt reports fatigue. Denies fever, malaise,  headache or abrupt weight changes.  HEENT: Pt reports blurred vision. Denies eye pain, eye redness, ear pain, ringing in the ears, wax buildup, runny nose, nasal congestion, bloody nose, or sore throat. Respiratory: Denies difficulty breathing, shortness of breath, cough or sputum production.   Cardiovascular: Denies chest pain, chest tightness, palpitations or swelling in the hands or feet.    Neurological: Pt reports dizziness and problems with balance. Denies difficulty with memory, difficulty with speech..   No other specific complaints in a complete review of systems (except as listed in HPI above).  Objective:   Physical Exam  BP 218/88  Pulse 65  Temp(Src) 97.5 F (36.4 C) (Oral)  Wt 185 lb (83.915 kg)  SpO2 98% Wt Readings from Last 3 Encounters:  01/08/14 185 lb (83.915 kg)  01/04/14 189 lb (85.73 kg)  11/09/13 186 lb (84.369 kg)    General: Appears her stated age, chronically ill appearing, in NAD. Cardiovascular: Normal rate and rhythm. S1,S2 noted.  No murmur, rubs or gallops noted. No JVD or BLE edema. No carotid bruits noted. Pulmonary/Chest: Increased effort and bibasilar crackles noted. No respiratory distress. No wheezes, or ronchi noted.  Neurological: Alert and oriented. No focal abnormality identified.   BMET    Component Value Date/Time   NA 141 06/17/2013 1127   K 5.1 06/17/2013 1127   CL 110 06/17/2013 1127   CO2 21 06/17/2013 1127   GLUCOSE 96 06/17/2013 1127   BUN 36* 06/17/2013 1127   CREATININE 2.0* 06/17/2013 1127   CALCIUM 9.2 06/17/2013 1127   GFRNONAA 37.05 04/19/2010 0000   GFRAA 56 10/04/2008 1236    Lipid Panel     Component Value Date/Time   CHOL 209* 11/14/2011 1205   TRIG 264.0* 11/14/2011 1205   HDL 41.90 11/14/2011 1205   CHOLHDL 5 11/14/2011 1205   VLDL 52.8* 11/14/2011 1205   LDLCALC 80 02/09/2008 1118    CBC    Component Value Date/Time   WBC 9.3 05/27/2013 1129   RBC 3.28* 05/27/2013 1129   HGB 9.4* 05/27/2013 1129   HCT 27.8* 05/27/2013 1129   PLT 200.0 05/27/2013 1129   MCV 84.9 05/27/2013 1129   MCHC 33.8 05/27/2013 1129   RDW 12.8 05/27/2013 1129   LYMPHSABS 2.2 05/27/2013 1129   MONOABS 0.9 05/27/2013 1129   EOSABS 0.7 05/27/2013 1129   BASOSABS 0.1 05/27/2013 1129    Hgb A1C Lab Results  Component Value Date   HGBA1C 5.9 05/16/2012         Assessment & Plan:   HTN:  Gave her 0.1 mg Clonidine in  office Repeat blood pressure 204/74- she does report that she does not feel as bad Gave her an additional 0.1  mg of clonidine Repeat BP: 204/74- her BP did not go down but she reports she feels much better. She is due for her afternoon carvedilol and lasix dose. She will monitor BP over the weekend. She declines blood work as she just had this done in the ER- I will try to locate it Continue coreg and lasix for now Advised her to follow up with Dr. Alphonsus SiasLetvak this upcoming Tuesday If worse over the weekend, go to ER

## 2014-01-08 NOTE — Patient Instructions (Addendum)

## 2014-01-12 ENCOUNTER — Telehealth: Payer: Self-pay | Admitting: Internal Medicine

## 2014-01-12 ENCOUNTER — Ambulatory Visit: Payer: Commercial Managed Care - HMO | Admitting: Internal Medicine

## 2014-01-12 DIAGNOSIS — K5289 Other specified noninfective gastroenteritis and colitis: Secondary | ICD-10-CM

## 2014-01-12 DIAGNOSIS — I5032 Chronic diastolic (congestive) heart failure: Secondary | ICD-10-CM

## 2014-01-12 DIAGNOSIS — M549 Dorsalgia, unspecified: Secondary | ICD-10-CM

## 2014-01-12 DIAGNOSIS — I10 Essential (primary) hypertension: Secondary | ICD-10-CM

## 2014-01-12 NOTE — Telephone Encounter (Signed)
Relevant patient education assigned to patient using Emmi. ° °

## 2014-01-15 DIAGNOSIS — Y92009 Unspecified place in unspecified non-institutional (private) residence as the place of occurrence of the external cause: Secondary | ICD-10-CM

## 2014-01-15 DIAGNOSIS — S300XXA Contusion of lower back and pelvis, initial encounter: Secondary | ICD-10-CM

## 2014-01-18 ENCOUNTER — Encounter: Payer: Self-pay | Admitting: Internal Medicine

## 2014-01-22 DIAGNOSIS — I129 Hypertensive chronic kidney disease with stage 1 through stage 4 chronic kidney disease, or unspecified chronic kidney disease: Secondary | ICD-10-CM

## 2014-01-22 DIAGNOSIS — D649 Anemia, unspecified: Secondary | ICD-10-CM

## 2014-01-22 DIAGNOSIS — N184 Chronic kidney disease, stage 4 (severe): Secondary | ICD-10-CM

## 2014-01-22 DIAGNOSIS — F3289 Other specified depressive episodes: Secondary | ICD-10-CM

## 2014-01-22 DIAGNOSIS — F329 Major depressive disorder, single episode, unspecified: Secondary | ICD-10-CM

## 2014-01-22 DIAGNOSIS — I5030 Unspecified diastolic (congestive) heart failure: Secondary | ICD-10-CM

## 2014-01-28 DIAGNOSIS — R197 Diarrhea, unspecified: Secondary | ICD-10-CM

## 2014-02-02 ENCOUNTER — Inpatient Hospital Stay: Payer: Self-pay | Admitting: Internal Medicine

## 2014-02-02 DIAGNOSIS — J9600 Acute respiratory failure, unspecified whether with hypoxia or hypercapnia: Secondary | ICD-10-CM

## 2014-02-02 LAB — COMPREHENSIVE METABOLIC PANEL
ALT: 22 U/L (ref 12–78)
ANION GAP: 11 (ref 7–16)
Albumin: 3.5 g/dL (ref 3.4–5.0)
Alkaline Phosphatase: 82 U/L
BUN: 41 mg/dL — AB (ref 7–18)
Bilirubin,Total: 0.4 mg/dL (ref 0.2–1.0)
CALCIUM: 8.5 mg/dL (ref 8.5–10.1)
Chloride: 105 mmol/L (ref 98–107)
Co2: 17 mmol/L — ABNORMAL LOW (ref 21–32)
Creatinine: 1.95 mg/dL — ABNORMAL HIGH (ref 0.60–1.30)
EGFR (African American): 27 — ABNORMAL LOW
GFR CALC NON AF AMER: 23 — AB
Glucose: 257 mg/dL — ABNORMAL HIGH (ref 65–99)
Osmolality: 285 (ref 275–301)
POTASSIUM: 5.1 mmol/L (ref 3.5–5.1)
SGOT(AST): 96 U/L — ABNORMAL HIGH (ref 15–37)
SODIUM: 133 mmol/L — AB (ref 136–145)
TOTAL PROTEIN: 7.4 g/dL (ref 6.4–8.2)

## 2014-02-02 LAB — URINALYSIS, COMPLETE
Bilirubin,UR: NEGATIVE
GLUCOSE, UR: NEGATIVE mg/dL (ref 0–75)
Ketone: NEGATIVE
Leukocyte Esterase: NEGATIVE
NITRITE: NEGATIVE
Ph: 5 (ref 4.5–8.0)
Protein: 100
RBC,UR: 2 /HPF (ref 0–5)
SPECIFIC GRAVITY: 1.01 (ref 1.003–1.030)
Squamous Epithelial: NONE SEEN
WBC UR: 5 /HPF (ref 0–5)

## 2014-02-02 LAB — CBC
HCT: 28.6 % — ABNORMAL LOW (ref 35.0–47.0)
HGB: 9 g/dL — ABNORMAL LOW (ref 12.0–16.0)
MCH: 28.2 pg (ref 26.0–34.0)
MCHC: 31.3 g/dL — ABNORMAL LOW (ref 32.0–36.0)
MCV: 90 fL (ref 80–100)
PLATELETS: 286 10*3/uL (ref 150–440)
RBC: 3.18 10*6/uL — ABNORMAL LOW (ref 3.80–5.20)
RDW: 13.9 % (ref 11.5–14.5)
WBC: 11.5 10*3/uL — ABNORMAL HIGH (ref 3.6–11.0)

## 2014-02-02 LAB — CK TOTAL AND CKMB (NOT AT ARMC)
CK, TOTAL: 178 U/L
CK, TOTAL: 80 U/L
CK, Total: 81 U/L
CK-MB: 2.9 ng/mL (ref 0.5–3.6)
CK-MB: 5.3 ng/mL — ABNORMAL HIGH (ref 0.5–3.6)
CK-MB: 5.8 ng/mL — ABNORMAL HIGH (ref 0.5–3.6)

## 2014-02-02 LAB — PRO B NATRIURETIC PEPTIDE: B-TYPE NATIURETIC PEPTID: 8012 pg/mL — AB (ref 0–450)

## 2014-02-02 LAB — TROPONIN I
TROPONIN-I: 1.4 ng/mL — AB
Troponin-I: 0.08 ng/mL — ABNORMAL HIGH
Troponin-I: 1.2 ng/mL — ABNORMAL HIGH

## 2014-02-02 LAB — CK-MB
CK-MB: 5.8 ng/mL — AB (ref 0.5–3.6)
CK-MB: 6 ng/mL — AB (ref 0.5–3.6)
CK-MB: 5.2 ng/mL — ABNORMAL HIGH (ref 0.5–3.6)

## 2014-02-03 LAB — LIPID PANEL
Cholesterol: 149 mg/dL (ref 0–200)
HDL Cholesterol: 47 mg/dL (ref 40–60)
LDL CHOLESTEROL, CALC: 73 mg/dL (ref 0–100)
Triglycerides: 147 mg/dL (ref 0–200)
VLDL Cholesterol, Calc: 29 mg/dL (ref 5–40)

## 2014-02-03 LAB — CBC WITH DIFFERENTIAL/PLATELET
BASOS PCT: 0.6 %
Basophil #: 0 10*3/uL (ref 0.0–0.1)
Eosinophil #: 0 10*3/uL (ref 0.0–0.7)
Eosinophil %: 0.1 %
HCT: 21.4 % — AB (ref 35.0–47.0)
HGB: 7.1 g/dL — ABNORMAL LOW (ref 12.0–16.0)
LYMPHS PCT: 14.1 %
Lymphocyte #: 1.1 10*3/uL (ref 1.0–3.6)
MCH: 29.2 pg (ref 26.0–34.0)
MCHC: 33 g/dL (ref 32.0–36.0)
MCV: 88 fL (ref 80–100)
MONO ABS: 0.2 x10 3/mm (ref 0.2–0.9)
Monocyte %: 2.6 %
Neutrophil #: 6.4 10*3/uL (ref 1.4–6.5)
Neutrophil %: 82.6 %
Platelet: 195 10*3/uL (ref 150–440)
RBC: 2.42 10*6/uL — ABNORMAL LOW (ref 3.80–5.20)
RDW: 13.6 % (ref 11.5–14.5)
WBC: 7.8 10*3/uL (ref 3.6–11.0)

## 2014-02-03 LAB — IRON AND TIBC
IRON BIND. CAP.(TOTAL): 339 ug/dL (ref 250–450)
IRON: 79 ug/dL (ref 50–170)
Iron Saturation: 23 %
UNBOUND IRON-BIND. CAP.: 260 ug/dL

## 2014-02-03 LAB — BASIC METABOLIC PANEL
Anion Gap: 12 (ref 7–16)
BUN: 50 mg/dL — ABNORMAL HIGH (ref 7–18)
Calcium, Total: 8.4 mg/dL — ABNORMAL LOW (ref 8.5–10.1)
Chloride: 106 mmol/L (ref 98–107)
Co2: 17 mmol/L — ABNORMAL LOW (ref 21–32)
Creatinine: 2.41 mg/dL — ABNORMAL HIGH (ref 0.60–1.30)
EGFR (Non-African Amer.): 18 — ABNORMAL LOW
GFR CALC AF AMER: 21 — AB
GLUCOSE: 138 mg/dL — AB (ref 65–99)
Osmolality: 286 (ref 275–301)
POTASSIUM: 4.5 mmol/L (ref 3.5–5.1)
Sodium: 135 mmol/L — ABNORMAL LOW (ref 136–145)

## 2014-02-03 LAB — HEMOGLOBIN: HGB: 6.8 g/dL — ABNORMAL LOW (ref 12.0–16.0)

## 2014-02-03 LAB — FERRITIN: Ferritin (ARMC): 259 ng/mL (ref 8–388)

## 2014-02-04 LAB — BASIC METABOLIC PANEL
Anion Gap: 11 (ref 7–16)
BUN: 66 mg/dL — ABNORMAL HIGH (ref 7–18)
Calcium, Total: 8.9 mg/dL (ref 8.5–10.1)
Chloride: 103 mmol/L (ref 98–107)
Co2: 18 mmol/L — ABNORMAL LOW (ref 21–32)
Creatinine: 2.69 mg/dL — ABNORMAL HIGH (ref 0.60–1.30)
EGFR (African American): 18 — ABNORMAL LOW
EGFR (Non-African Amer.): 16 — ABNORMAL LOW
Glucose: 105 mg/dL — ABNORMAL HIGH (ref 65–99)
Osmolality: 284 (ref 275–301)
Potassium: 4.3 mmol/L (ref 3.5–5.1)
Sodium: 132 mmol/L — ABNORMAL LOW (ref 136–145)

## 2014-02-04 LAB — CBC WITH DIFFERENTIAL/PLATELET
Basophil #: 0 10*3/uL (ref 0.0–0.1)
Basophil %: 0.3 %
Eosinophil #: 0.1 10*3/uL (ref 0.0–0.7)
Eosinophil %: 0.6 %
HCT: 22.1 % — AB (ref 35.0–47.0)
HGB: 7.2 g/dL — AB (ref 12.0–16.0)
Lymphocyte #: 1.7 10*3/uL (ref 1.0–3.6)
Lymphocyte %: 14.8 %
MCH: 28.8 pg (ref 26.0–34.0)
MCHC: 32.5 g/dL (ref 32.0–36.0)
MCV: 89 fL (ref 80–100)
MONOS PCT: 13.9 %
Monocyte #: 1.6 x10 3/mm — ABNORMAL HIGH (ref 0.2–0.9)
NEUTROS ABS: 8 10*3/uL — AB (ref 1.4–6.5)
NEUTROS PCT: 70.4 %
Platelet: 198 10*3/uL (ref 150–440)
RBC: 2.49 10*6/uL — ABNORMAL LOW (ref 3.80–5.20)
RDW: 13.5 % (ref 11.5–14.5)
WBC: 11.3 10*3/uL — ABNORMAL HIGH (ref 3.6–11.0)

## 2014-02-05 LAB — CBC WITH DIFFERENTIAL/PLATELET
BASOS ABS: 0.1 10*3/uL (ref 0.0–0.1)
Basophil %: 0.8 %
EOS PCT: 4.5 %
Eosinophil #: 0.5 10*3/uL (ref 0.0–0.7)
HCT: 22.8 % — ABNORMAL LOW (ref 35.0–47.0)
HGB: 7.4 g/dL — ABNORMAL LOW (ref 12.0–16.0)
LYMPHS ABS: 2.4 10*3/uL (ref 1.0–3.6)
LYMPHS PCT: 20.6 %
MCH: 28.5 pg (ref 26.0–34.0)
MCHC: 32.5 g/dL (ref 32.0–36.0)
MCV: 88 fL (ref 80–100)
MONOS PCT: 14.7 %
Monocyte #: 1.7 x10 3/mm — ABNORMAL HIGH (ref 0.2–0.9)
NEUTROS ABS: 6.8 10*3/uL — AB (ref 1.4–6.5)
NEUTROS PCT: 59.4 %
Platelet: 208 10*3/uL (ref 150–440)
RBC: 2.6 10*6/uL — AB (ref 3.80–5.20)
RDW: 13.9 % (ref 11.5–14.5)
WBC: 11.5 10*3/uL — ABNORMAL HIGH (ref 3.6–11.0)

## 2014-02-05 LAB — BASIC METABOLIC PANEL
ANION GAP: 10 (ref 7–16)
BUN: 77 mg/dL — AB (ref 7–18)
Calcium, Total: 8.8 mg/dL (ref 8.5–10.1)
Chloride: 104 mmol/L (ref 98–107)
Co2: 19 mmol/L — ABNORMAL LOW (ref 21–32)
Creatinine: 2.86 mg/dL — ABNORMAL HIGH (ref 0.60–1.30)
EGFR (African American): 17 — ABNORMAL LOW
EGFR (Non-African Amer.): 15 — ABNORMAL LOW
Glucose: 99 mg/dL (ref 65–99)
Osmolality: 289 (ref 275–301)
Potassium: 4 mmol/L (ref 3.5–5.1)
Sodium: 133 mmol/L — ABNORMAL LOW (ref 136–145)

## 2014-02-06 LAB — BASIC METABOLIC PANEL
ANION GAP: 10 (ref 7–16)
BUN: 70 mg/dL — AB (ref 7–18)
Calcium, Total: 8.6 mg/dL (ref 8.5–10.1)
Chloride: 103 mmol/L (ref 98–107)
Co2: 20 mmol/L — ABNORMAL LOW (ref 21–32)
Creatinine: 2.85 mg/dL — ABNORMAL HIGH (ref 0.60–1.30)
EGFR (African American): 17 — ABNORMAL LOW
GFR CALC NON AF AMER: 15 — AB
Glucose: 121 mg/dL — ABNORMAL HIGH (ref 65–99)
Osmolality: 288 (ref 275–301)
POTASSIUM: 3.8 mmol/L (ref 3.5–5.1)
Sodium: 133 mmol/L — ABNORMAL LOW (ref 136–145)

## 2014-02-06 LAB — CULTURE, BLOOD (SINGLE)

## 2014-02-07 LAB — BASIC METABOLIC PANEL
Anion Gap: 11 (ref 7–16)
BUN: 75 mg/dL — ABNORMAL HIGH (ref 7–18)
CALCIUM: 8.6 mg/dL (ref 8.5–10.1)
CHLORIDE: 104 mmol/L (ref 98–107)
Co2: 19 mmol/L — ABNORMAL LOW (ref 21–32)
Creatinine: 2.85 mg/dL — ABNORMAL HIGH (ref 0.60–1.30)
EGFR (Non-African Amer.): 15 — ABNORMAL LOW
GFR CALC AF AMER: 17 — AB
Glucose: 127 mg/dL — ABNORMAL HIGH (ref 65–99)
Osmolality: 292 (ref 275–301)
Potassium: 3.8 mmol/L (ref 3.5–5.1)
Sodium: 134 mmol/L — ABNORMAL LOW (ref 136–145)

## 2014-02-07 LAB — CULTURE, BLOOD (SINGLE)

## 2014-02-08 ENCOUNTER — Encounter: Payer: Self-pay | Admitting: Internal Medicine

## 2014-02-08 LAB — BASIC METABOLIC PANEL
Anion Gap: 12 (ref 7–16)
BUN: 78 mg/dL — ABNORMAL HIGH (ref 7–18)
CHLORIDE: 103 mmol/L (ref 98–107)
CO2: 17 mmol/L — AB (ref 21–32)
Calcium, Total: 8.7 mg/dL (ref 8.5–10.1)
Creatinine: 3.09 mg/dL — ABNORMAL HIGH (ref 0.60–1.30)
GFR CALC AF AMER: 16 — AB
GFR CALC NON AF AMER: 13 — AB
Glucose: 114 mg/dL — ABNORMAL HIGH (ref 65–99)
Osmolality: 289 (ref 275–301)
Potassium: 3.7 mmol/L (ref 3.5–5.1)
Sodium: 132 mmol/L — ABNORMAL LOW (ref 136–145)

## 2014-02-09 LAB — BASIC METABOLIC PANEL
Anion Gap: 13 (ref 7–16)
BUN: 93 mg/dL — ABNORMAL HIGH (ref 7–18)
CHLORIDE: 99 mmol/L (ref 98–107)
CO2: 16 mmol/L — AB (ref 21–32)
Calcium, Total: 8.8 mg/dL (ref 8.5–10.1)
Creatinine: 3.1 mg/dL — ABNORMAL HIGH (ref 0.60–1.30)
EGFR (African American): 15 — ABNORMAL LOW
EGFR (Non-African Amer.): 13 — ABNORMAL LOW
GLUCOSE: 107 mg/dL — AB (ref 65–99)
Osmolality: 286 (ref 275–301)
POTASSIUM: 3.5 mmol/L (ref 3.5–5.1)
SODIUM: 128 mmol/L — AB (ref 136–145)

## 2014-02-09 LAB — URINALYSIS, COMPLETE
BILIRUBIN, UR: NEGATIVE
Blood: NEGATIVE
GLUCOSE, UR: NEGATIVE mg/dL (ref 0–75)
Hyaline Cast: 2
Ketone: NEGATIVE
Nitrite: NEGATIVE
PH: 5 (ref 4.5–8.0)
Protein: 30
RBC,UR: 14 /HPF (ref 0–5)
SPECIFIC GRAVITY: 1.011 (ref 1.003–1.030)

## 2014-02-10 ENCOUNTER — Ambulatory Visit: Payer: Medicare HMO | Admitting: Internal Medicine

## 2014-02-10 LAB — BASIC METABOLIC PANEL
ANION GAP: 14 (ref 7–16)
BUN: 83 mg/dL — ABNORMAL HIGH (ref 7–18)
CREATININE: 3 mg/dL — AB (ref 0.60–1.30)
Calcium, Total: 8.9 mg/dL (ref 8.5–10.1)
Chloride: 96 mmol/L — ABNORMAL LOW (ref 98–107)
Co2: 17 mmol/L — ABNORMAL LOW (ref 21–32)
EGFR (African American): 16 — ABNORMAL LOW
GFR CALC NON AF AMER: 14 — AB
Glucose: 107 mg/dL — ABNORMAL HIGH (ref 65–99)
Osmolality: 281 (ref 275–301)
Potassium: 3.8 mmol/L (ref 3.5–5.1)
SODIUM: 127 mmol/L — AB (ref 136–145)

## 2014-02-11 LAB — BASIC METABOLIC PANEL
ANION GAP: 12 (ref 7–16)
BUN: 85 mg/dL — AB (ref 7–18)
CHLORIDE: 96 mmol/L — AB (ref 98–107)
Calcium, Total: 9.3 mg/dL (ref 8.5–10.1)
Co2: 17 mmol/L — ABNORMAL LOW (ref 21–32)
Creatinine: 2.85 mg/dL — ABNORMAL HIGH (ref 0.60–1.30)
EGFR (Non-African Amer.): 15 — ABNORMAL LOW
GFR CALC AF AMER: 17 — AB
Glucose: 98 mg/dL (ref 65–99)
OSMOLALITY: 277 (ref 275–301)
Potassium: 3.8 mmol/L (ref 3.5–5.1)
SODIUM: 125 mmol/L — AB (ref 136–145)

## 2014-02-11 LAB — HEMOGLOBIN: HGB: 7.3 g/dL — ABNORMAL LOW (ref 12.0–16.0)

## 2014-02-12 LAB — CULTURE, BLOOD (SINGLE)

## 2014-02-12 LAB — BASIC METABOLIC PANEL
Anion Gap: 13 (ref 7–16)
BUN: 86 mg/dL — ABNORMAL HIGH (ref 7–18)
CREATININE: 2.9 mg/dL — AB (ref 0.60–1.30)
Calcium, Total: 8.7 mg/dL (ref 8.5–10.1)
Chloride: 95 mmol/L — ABNORMAL LOW (ref 98–107)
Co2: 18 mmol/L — ABNORMAL LOW (ref 21–32)
EGFR (African American): 17 — ABNORMAL LOW
GFR CALC NON AF AMER: 14 — AB
Glucose: 91 mg/dL (ref 65–99)
OSMOLALITY: 279 (ref 275–301)
POTASSIUM: 3.9 mmol/L (ref 3.5–5.1)
SODIUM: 126 mmol/L — AB (ref 136–145)

## 2014-02-12 LAB — URINE CULTURE

## 2014-02-12 LAB — PHOSPHORUS: PHOSPHORUS: 4.5 mg/dL (ref 2.5–4.9)

## 2014-02-13 LAB — CBC WITH DIFFERENTIAL/PLATELET
BASOS ABS: 0 10*3/uL (ref 0.0–0.1)
Basophil %: 0.4 %
EOS ABS: 0.1 10*3/uL (ref 0.0–0.7)
Eosinophil %: 1.4 %
HCT: 19 % — ABNORMAL LOW (ref 35.0–47.0)
HGB: 6.2 g/dL — ABNORMAL LOW (ref 12.0–16.0)
LYMPHS PCT: 9.9 %
Lymphocyte #: 0.9 10*3/uL — ABNORMAL LOW (ref 1.0–3.6)
MCH: 28.1 pg (ref 26.0–34.0)
MCHC: 32.8 g/dL (ref 32.0–36.0)
MCV: 86 fL (ref 80–100)
MONO ABS: 1.4 x10 3/mm — AB (ref 0.2–0.9)
Monocyte %: 15.8 %
Neutrophil #: 6.5 10*3/uL (ref 1.4–6.5)
Neutrophil %: 72.5 %
PLATELETS: 268 10*3/uL (ref 150–440)
RBC: 2.22 10*6/uL — ABNORMAL LOW (ref 3.80–5.20)
RDW: 13.4 % (ref 11.5–14.5)
WBC: 9 10*3/uL (ref 3.6–11.0)

## 2014-02-13 LAB — BASIC METABOLIC PANEL
Anion Gap: 11 (ref 7–16)
BUN: 63 mg/dL — AB (ref 7–18)
CO2: 20 mmol/L — AB (ref 21–32)
Calcium, Total: 8.2 mg/dL — ABNORMAL LOW (ref 8.5–10.1)
Chloride: 97 mmol/L — ABNORMAL LOW (ref 98–107)
Creatinine: 2.63 mg/dL — ABNORMAL HIGH (ref 0.60–1.30)
EGFR (Non-African Amer.): 16 — ABNORMAL LOW
GFR CALC AF AMER: 19 — AB
GLUCOSE: 97 mg/dL (ref 65–99)
OSMOLALITY: 275 (ref 275–301)
POTASSIUM: 3.7 mmol/L (ref 3.5–5.1)
SODIUM: 128 mmol/L — AB (ref 136–145)

## 2014-02-13 LAB — MAGNESIUM: Magnesium: 1.5 mg/dL — ABNORMAL LOW

## 2014-02-13 LAB — PHOSPHORUS: PHOSPHORUS: 3.3 mg/dL (ref 2.5–4.9)

## 2014-02-14 ENCOUNTER — Ambulatory Visit: Payer: Self-pay | Admitting: Neurology

## 2014-02-14 LAB — CBC WITH DIFFERENTIAL/PLATELET
Basophil #: 0 10*3/uL (ref 0.0–0.1)
Basophil #: 0 10*3/uL (ref 0.0–0.1)
Basophil %: 0.3 %
Basophil %: 0.3 %
EOS ABS: 0.1 10*3/uL (ref 0.0–0.7)
Eosinophil #: 0.2 10*3/uL (ref 0.0–0.7)
Eosinophil %: 1 %
Eosinophil %: 1.3 %
HCT: 23.4 % — ABNORMAL LOW (ref 35.0–47.0)
HCT: 24 % — AB (ref 35.0–47.0)
HGB: 7.7 g/dL — AB (ref 12.0–16.0)
HGB: 7.5 g/dL — AB (ref 12.0–16.0)
Lymphocyte #: 1.5 10*3/uL (ref 1.0–3.6)
Lymphocyte #: 1.7 10*3/uL (ref 1.0–3.6)
Lymphocyte %: 11.6 %
Lymphocyte %: 12.2 %
MCH: 29.1 pg (ref 26.0–34.0)
MCH: 27.4 pg (ref 26.0–34.0)
MCHC: 33 g/dL (ref 32.0–36.0)
MCHC: 31.3 g/dL — ABNORMAL LOW (ref 32.0–36.0)
MCV: 88 fL (ref 80–100)
MCV: 88 fL (ref 80–100)
MONO ABS: 2 x10 3/mm — AB (ref 0.2–0.9)
MONO ABS: 2 x10 3/mm — AB (ref 0.2–0.9)
Monocyte %: 15.4 %
Monocyte %: 14.3 %
NEUTROS ABS: 9.4 10*3/uL — AB (ref 1.4–6.5)
NEUTROS PCT: 71.7 %
Neutrophil #: 9.9 10*3/uL — ABNORMAL HIGH (ref 1.4–6.5)
Neutrophil %: 71.9 %
PLATELETS: 265 10*3/uL (ref 150–440)
PLATELETS: 271 10*3/uL (ref 150–440)
RBC: 2.66 10*6/uL — ABNORMAL LOW (ref 3.80–5.20)
RBC: 2.74 10*6/uL — ABNORMAL LOW (ref 3.80–5.20)
RDW: 14.7 % — AB (ref 11.5–14.5)
RDW: 14.3 % (ref 11.5–14.5)
WBC: 13.1 10*3/uL — ABNORMAL HIGH (ref 3.6–11.0)
WBC: 13.8 10*3/uL — ABNORMAL HIGH (ref 3.6–11.0)

## 2014-02-14 LAB — BASIC METABOLIC PANEL
Anion Gap: 9 (ref 7–16)
Anion Gap: 5 — ABNORMAL LOW (ref 7–16)
BUN: 33 mg/dL — ABNORMAL HIGH (ref 7–18)
BUN: 33 mg/dL — ABNORMAL HIGH (ref 7–18)
CHLORIDE: 99 mmol/L (ref 98–107)
Calcium, Total: 8.7 mg/dL (ref 8.5–10.1)
Calcium, Total: 8.3 mg/dL — ABNORMAL LOW (ref 8.5–10.1)
Chloride: 99 mmol/L (ref 98–107)
Co2: 26 mmol/L (ref 21–32)
Co2: 30 mmol/L (ref 21–32)
Creatinine: 1.87 mg/dL — ABNORMAL HIGH (ref 0.60–1.30)
Creatinine: 1.94 mg/dL — ABNORMAL HIGH (ref 0.60–1.30)
EGFR (African American): 29 — ABNORMAL LOW
EGFR (African American): 27 — ABNORMAL LOW
GFR CALC NON AF AMER: 25 — AB
GFR CALC NON AF AMER: 24 — AB
Glucose: 106 mg/dL — ABNORMAL HIGH (ref 65–99)
Glucose: 118 mg/dL — ABNORMAL HIGH (ref 65–99)
OSMOLALITY: 277 (ref 275–301)
Osmolality: 276 (ref 275–301)
Potassium: 3.5 mmol/L (ref 3.5–5.1)
Potassium: 3.6 mmol/L (ref 3.5–5.1)
SODIUM: 134 mmol/L — AB (ref 136–145)
SODIUM: 134 mmol/L — AB (ref 136–145)

## 2014-02-14 LAB — MAGNESIUM: MAGNESIUM: 1.9 mg/dL

## 2014-02-15 LAB — BASIC METABOLIC PANEL
Anion Gap: 7 (ref 7–16)
BUN: 36 mg/dL — ABNORMAL HIGH (ref 7–18)
Calcium, Total: 8.5 mg/dL (ref 8.5–10.1)
Chloride: 98 mmol/L (ref 98–107)
Co2: 29 mmol/L (ref 21–32)
Creatinine: 2.23 mg/dL — ABNORMAL HIGH (ref 0.60–1.30)
EGFR (African American): 23 — ABNORMAL LOW
EGFR (Non-African Amer.): 20 — ABNORMAL LOW
GLUCOSE: 110 mg/dL — AB (ref 65–99)
OSMOLALITY: 277 (ref 275–301)
Potassium: 3.4 mmol/L — ABNORMAL LOW (ref 3.5–5.1)
SODIUM: 134 mmol/L — AB (ref 136–145)

## 2014-02-15 LAB — CBC WITH DIFFERENTIAL/PLATELET
BASOS ABS: 0.1 10*3/uL (ref 0.0–0.1)
Basophil %: 0.8 %
EOS ABS: 0.4 10*3/uL (ref 0.0–0.7)
Eosinophil %: 2.7 %
HCT: 22.4 % — ABNORMAL LOW (ref 35.0–47.0)
HGB: 7.1 g/dL — AB (ref 12.0–16.0)
LYMPHS ABS: 2.5 10*3/uL (ref 1.0–3.6)
Lymphocyte %: 17.4 %
MCH: 28 pg (ref 26.0–34.0)
MCHC: 31.9 g/dL — ABNORMAL LOW (ref 32.0–36.0)
MCV: 88 fL (ref 80–100)
Monocyte #: 1.8 x10 3/mm — ABNORMAL HIGH (ref 0.2–0.9)
Monocyte %: 12.9 %
NEUTROS ABS: 9.4 10*3/uL — AB (ref 1.4–6.5)
Neutrophil %: 66.2 %
Platelet: 249 10*3/uL (ref 150–440)
RBC: 2.54 10*6/uL — ABNORMAL LOW (ref 3.80–5.20)
RDW: 15.2 % — AB (ref 11.5–14.5)
WBC: 14.2 10*3/uL — AB (ref 3.6–11.0)

## 2014-02-16 LAB — BASIC METABOLIC PANEL
Anion Gap: 7 (ref 7–16)
BUN: 41 mg/dL — ABNORMAL HIGH (ref 7–18)
CALCIUM: 9 mg/dL (ref 8.5–10.1)
CHLORIDE: 99 mmol/L (ref 98–107)
Co2: 29 mmol/L (ref 21–32)
Creatinine: 2.33 mg/dL — ABNORMAL HIGH (ref 0.60–1.30)
EGFR (Non-African Amer.): 19 — ABNORMAL LOW
GFR CALC AF AMER: 22 — AB
Glucose: 112 mg/dL — ABNORMAL HIGH (ref 65–99)
OSMOLALITY: 281 (ref 275–301)
POTASSIUM: 3.5 mmol/L (ref 3.5–5.1)
SODIUM: 135 mmol/L — AB (ref 136–145)

## 2014-02-16 LAB — CBC WITH DIFFERENTIAL/PLATELET
BASOS ABS: 0.1 10*3/uL (ref 0.0–0.1)
Basophil %: 0.6 %
EOS PCT: 3.6 %
Eosinophil #: 0.4 10*3/uL (ref 0.0–0.7)
HCT: 24.1 % — ABNORMAL LOW (ref 35.0–47.0)
HGB: 7.5 g/dL — ABNORMAL LOW (ref 12.0–16.0)
LYMPHS ABS: 1.9 10*3/uL (ref 1.0–3.6)
Lymphocyte %: 16.6 %
MCH: 27.8 pg (ref 26.0–34.0)
MCHC: 31.3 g/dL — AB (ref 32.0–36.0)
MCV: 89 fL (ref 80–100)
MONOS PCT: 12.3 %
Monocyte #: 1.4 x10 3/mm — ABNORMAL HIGH (ref 0.2–0.9)
Neutrophil #: 7.6 10*3/uL — ABNORMAL HIGH (ref 1.4–6.5)
Neutrophil %: 66.9 %
Platelet: 247 10*3/uL (ref 150–440)
RBC: 2.71 10*6/uL — ABNORMAL LOW (ref 3.80–5.20)
RDW: 15.1 % — AB (ref 11.5–14.5)
WBC: 11.3 10*3/uL — ABNORMAL HIGH (ref 3.6–11.0)

## 2014-02-17 LAB — CBC WITH DIFFERENTIAL/PLATELET
BASOS ABS: 0.1 10*3/uL (ref 0.0–0.1)
Basophil %: 0.8 %
EOS ABS: 0.4 10*3/uL (ref 0.0–0.7)
Eosinophil %: 3.9 %
HCT: 24.7 % — ABNORMAL LOW (ref 35.0–47.0)
HGB: 8.2 g/dL — ABNORMAL LOW (ref 12.0–16.0)
Lymphocyte #: 1.3 10*3/uL (ref 1.0–3.6)
Lymphocyte %: 12.8 %
MCH: 29.6 pg (ref 26.0–34.0)
MCHC: 33.2 g/dL (ref 32.0–36.0)
MCV: 89 fL (ref 80–100)
MONO ABS: 0.9 x10 3/mm (ref 0.2–0.9)
MONOS PCT: 9.3 %
Neutrophil #: 7.4 10*3/uL — ABNORMAL HIGH (ref 1.4–6.5)
Neutrophil %: 73.2 %
PLATELETS: 212 10*3/uL (ref 150–440)
RBC: 2.76 10*6/uL — ABNORMAL LOW (ref 3.80–5.20)
RDW: 14.8 % — ABNORMAL HIGH (ref 11.5–14.5)
WBC: 10.1 10*3/uL (ref 3.6–11.0)

## 2014-02-17 LAB — BASIC METABOLIC PANEL
Anion Gap: 4 — ABNORMAL LOW (ref 7–16)
BUN: 37 mg/dL — AB (ref 7–18)
CALCIUM: 8.4 mg/dL — AB (ref 8.5–10.1)
CHLORIDE: 102 mmol/L (ref 98–107)
Co2: 30 mmol/L (ref 21–32)
Creatinine: 2.08 mg/dL — ABNORMAL HIGH (ref 0.60–1.30)
EGFR (African American): 25 — ABNORMAL LOW
GFR CALC NON AF AMER: 22 — AB
GLUCOSE: 97 mg/dL (ref 65–99)
OSMOLALITY: 281 (ref 275–301)
Potassium: 3.8 mmol/L (ref 3.5–5.1)
Sodium: 136 mmol/L (ref 136–145)

## 2014-02-18 LAB — BASIC METABOLIC PANEL
ANION GAP: 9 (ref 7–16)
BUN: 34 mg/dL — ABNORMAL HIGH (ref 7–18)
CHLORIDE: 96 mmol/L — AB (ref 98–107)
CO2: 28 mmol/L (ref 21–32)
Calcium, Total: 8.9 mg/dL (ref 8.5–10.1)
Creatinine: 1.87 mg/dL — ABNORMAL HIGH (ref 0.60–1.30)
EGFR (African American): 29 — ABNORMAL LOW
EGFR (Non-African Amer.): 25 — ABNORMAL LOW
Glucose: 95 mg/dL (ref 65–99)
OSMOLALITY: 274 (ref 275–301)
POTASSIUM: 3.9 mmol/L (ref 3.5–5.1)
SODIUM: 133 mmol/L — AB (ref 136–145)

## 2014-02-18 LAB — CBC WITH DIFFERENTIAL/PLATELET
Basophil #: 0 10*3/uL (ref 0.0–0.1)
Basophil %: 0.4 %
EOS ABS: 0.2 10*3/uL (ref 0.0–0.7)
EOS PCT: 2.2 %
HCT: 30.7 % — AB (ref 35.0–47.0)
HGB: 9.9 g/dL — AB (ref 12.0–16.0)
LYMPHS ABS: 1.1 10*3/uL (ref 1.0–3.6)
Lymphocyte %: 10.6 %
MCH: 28.6 pg (ref 26.0–34.0)
MCHC: 32.2 g/dL (ref 32.0–36.0)
MCV: 89 fL (ref 80–100)
MONO ABS: 1.1 x10 3/mm — AB (ref 0.2–0.9)
Monocyte %: 10.5 %
Neutrophil #: 8.2 10*3/uL — ABNORMAL HIGH (ref 1.4–6.5)
Neutrophil %: 76.3 %
Platelet: 250 10*3/uL (ref 150–440)
RBC: 3.45 10*6/uL — ABNORMAL LOW (ref 3.80–5.20)
RDW: 14.9 % — AB (ref 11.5–14.5)
WBC: 10.8 10*3/uL (ref 3.6–11.0)

## 2014-02-22 DIAGNOSIS — I214 Non-ST elevation (NSTEMI) myocardial infarction: Secondary | ICD-10-CM

## 2014-02-22 DIAGNOSIS — N183 Chronic kidney disease, stage 3 unspecified: Secondary | ICD-10-CM

## 2014-02-22 DIAGNOSIS — M549 Dorsalgia, unspecified: Secondary | ICD-10-CM

## 2014-02-22 DIAGNOSIS — I5032 Chronic diastolic (congestive) heart failure: Secondary | ICD-10-CM

## 2014-02-23 DIAGNOSIS — I209 Angina pectoris, unspecified: Secondary | ICD-10-CM

## 2014-02-24 ENCOUNTER — Telehealth: Payer: Self-pay | Admitting: *Deleted

## 2014-02-24 MED ORDER — MORPHINE SULFATE 20 MG/5ML PO SOLN: 2.5000 mg | ORAL | Status: AC | PRN

## 2014-02-24 NOTE — Telephone Encounter (Signed)
Rx signed I had also [facility] yesterday

## 2014-02-24 NOTE — Telephone Encounter (Signed)
rx faxed to pharmacy manually  

## 2014-02-24 NOTE — Telephone Encounter (Signed)
Faxed request asking for Morphine ( I added to list) please see form on your desk to be faxed back to [facility].

## 2014-03-01 DIAGNOSIS — I5033 Acute on chronic diastolic (congestive) heart failure: Secondary | ICD-10-CM

## 2014-03-03 DIAGNOSIS — I509 Heart failure, unspecified: Secondary | ICD-10-CM

## 2014-03-20 DEATH — deceased

## 2014-12-10 NOTE — Op Note (Signed)
PATIENT NAME:  Bailey Ramirez, Bailey Ramirez MR#:  [id number] DATE OF BIRTH:  [birth year]  DATE OF PROCEDURE:  11/25/2012  PREOPERATIVE DIAGNOSIS: Visually significant cataract of the right eye.   POSTOPERATIVE DIAGNOSIS: Visually significant cataract of the right eye.   OPERATIVE PROCEDURE: Cataract extraction by phacoemulsification with implant of intraocular lens to right eye.   SURGEON: Galen ManilaWilliam Tarus Briski, MD.   ANESTHESIA: Topical.  COMPLICATIONS: None.   SURGICAL TECHNIQUE:  Stop-and-chop.  DESCRIPTION OF PROCEDURE: The patient was examined and consented in the preoperative holding area where the aforementioned topical anesthesia was applied to the right eye and then brought back to the operating room where the right eye was prepped and draped in the usual sterile ophthalmic fashion and a lid speculum was placed. A paracentesis was created with the side port blade and the anterior chamber was filled with viscoelastic. A near clear corneal incision was performed with the steel keratome. A continuous curvilinear capsulorrhexis was performed with a cystotome followed by the capsulorrhexis forceps. Hydrodissection and hydrodelineation were carried out with BSS on a blunt cannula. The lens was removed in a stop-and-chop technique and the remaining cortical material was removed with the irrigation-aspiration handpiece. The capsular bag was inflated with viscoelastic and the Tecnis ZCB00 21.5-diopter lens, serial number [id number] was placed in the capsular bag without complication. The remaining viscoelastic was removed from the eye with the irrigation-aspiration handpiece. The wounds were hydrated. The anterior chamber was flushed with Miostat and the eye was inflated to physiologic pressure, and 0.1 mL of cefuroxime concentration 10 mg/mL was placed in the anterior chamber. The wounds were found to be watertight. The eye was dressed with Vigamox. The patient was given protective glasses to wear throughout the day and  a shield with which to sleep tonight. The patient was also given drops with which to begin a drop regimen today and will follow up with me in one day.      ____________________________ Bailey FieldWilliam L. Ahmaad Neidhardt, MD wlp:dm D: 11/25/2012 12:38:00 ET T: 11/25/2012 13:32:10 ET JOB#: [id number]  cc: Zuriel Roskos L. Shauntell Iglesia, MD, <Dictator> Bailey FieldWILLIAM L Mabell Esguerra MD ELECTRONICALLY SIGNED 12/02/2012 12:51

## 2014-12-10 NOTE — H&P (Signed)
PATIENT NAME:  Bailey Ramirez, Bailey Ramirez MR#:  [id number] DATE OF BIRTH:  20-Jun-1929  DATE OF ADMISSION:  06/20/2013  PRIMARY CARE PHYSICIAN: Dr. Alphonsus SiasLetvak.   REFERRING PHYSICIAN:  Dr. Margarita GrizzleWoodruff of ER.  CHIEF COMPLAINT: Shortness of breath.   HISTORY OF PRESENTING ILLNESS: An 81 year old Caucasian female patient, who is a resident of independent living facility with a history of asthma, hypertension, not on home oxygen, presents to the hospital with acute onset of shortness of breath. The patient's family contributes to most of the history. She was doing well until yesterday but today in the morning the nursing staff at the independent living facility called EMS and on arrival her sats were 60%. The patient was extremely short of breath and brought to the Emergency Room. Here, the patient's chest x-ray shows florid pulmonary edema needing BiPAP support and is being admitted to the hospitalist service. The patient does not have any history of congestive heart failure.   She does complain of some orthopnea, lower extremity swelling which is chronic. Does have some cough which is nonproductive. The patient had a catheterization about 10 years back with Dr. Gwen PoundsKowalski which was normal per family.   PAST MEDICAL HISTORY: 1.  Hypertension.  2.  Diet-controlled diabetes mellitus.  3.  Asthma.  4.  Pernicious anemia.  5.  Lumbar spinal stenosis.  6.  Hyperlipidemia.  7.  Anxiety and depression.  8.  Degenerative arthritis.   PAST SURGICAL HISTORY: Tonsillectomy, hysterectomy, ruptured disk.   FAMILY HISTORY: Father died of emphysema. Mother died of lung cancer.   SOCIAL HISTORY: The patient is widowed, has 2 children. Former smoker, quit in 1988.   ALLERGIES: PHENERGAN, SULFA AND MELOXICAM.   REVIEW OF SYSTEMS:  Unobtainable due to the patient being on BiPAP and hard of hearing.   HOME MEDICATIONS: Include:  2.  Amlodipine 5 mg daily.  3.  Citalopram 40 mg daily.  4.  Nystatin topical 100,000 units topical 3  times a day as needed.  5.  Omeprazole 40 mg daily.  6.  Multivitamin daily.  7.  Premarin vaginal 1 application 3 times a week.  8.  Proventil HFA 98 mcg inhaled 2 puffs 4 times a day as needed for shortness of breath.   PHYSICAL EXAMINATION: VITAL SIGNS: Temperature 97.5, pulse of 113, blood pressure 220/113, saturating 98% on BiPAP.  GENERAL:  Obese Caucasian female patient lying in bed in significant respiratory distress on BiPAP.  HEENT: Atraumatic, normocephalic. Oral mucosa moist and pink. External ears and nose normal. No pallor. No icterus. Pupils bilaterally equal and reactive to light.  NECK: Supple. No thyromegaly. No palpable lymph nodes. Trachea midline. No carotid bruit, JVD.  CARDIOVASCULAR: S1, S2, tachycardic. No murmurs, 1+ edema.  RESPIRATORY: Bilateral crackles and wheezing.  GASTROINTESTINAL: Soft abdomen, nontender. Bowel sounds present.  GENITOURINARY: No CVA tenderness or bladder distention.  SKIN: No petechiae, rash, ulcers.  NEUROLOGIC:  Motor strength 5  over 5 in upper and lower extremities.   LABORATORY STUDIES:  BNP of 2480, BUN 34, creatinine 1.74, sodium 140, potassium 3.9, chloride 112, bicarbonate 18.  Lactic acid 0.8. PH of 7.21, pO2 of 132, pCO2 44. WBC 10.9, hemoglobin 10.8. Troponin less than 0.02.   EKG shows a  left bundle branch block which is old.   Chest x-ray shows pulmonary edema along with small bilateral pleural effusions.   ASSESSMENT AND PLAN: 1.  Acute new-onset congestive heart failure in a patient with history of hypertension, asthma. We will get 2 more  sets of cardiac enzymes to rule out acute coronary syndrome. Start the patient on IV Lasix 40 b.i.d. along with monitoring creatinine. Consult Cardiology. Check an echo for ejection fraction, ins and outs.  The patient may need catheterization depending on the results of the echocardiogram.  2.  Acute respiratory failure secondary to pulmonary edema. The patient will be on Lasix, BiPAP  support, admitted to Critical Care Unit and nebulizer p.r.n. Does have wheezing at this time with history of asthma, but I think this is secondary to her pulmonary edema.  3.  Accelerated hypertension, improved with 1 dose of labetalol. We will add metoprolol. The patient recently stopped her lisinopril and hydrochlorothiazide as per her primary care physician's instruction secondary to worsening creatinine.  4.  Acute renal failure with chronic kidney disease or possibly progressive chronic kidney disease.  We will need to monitor creatinine in this time as the patient is on Lasix. The patient has been on naproxen for a long time which was stopped a week back which could have contributed to her worsening creatinine. Her hydrochlorothiazide, lisinopril and naproxen will be stopped. The patient had an appointment with Dr. Wynelle Link as outpatient. We will consult his group for further input with the case. Monitor ins and outs.   5.  Deep vein thrombosis prophylaxis with heparin.   CODE STATUS: DNR/DNI.   Time spent today on this critically ill patient with BiPAP being admitted to stepdown Critical Care Unit, is 45 minutes.    ____________________________ Molinda Bailiff Aerilynn Goin, MD srs:dp D: 06/20/2013 12:37:01 ET T: 06/20/2013 13:19:21 ET JOB#: [id number]  cc: Wardell Heath R. Elpidio Anis, MD, <Dictator> Karie Schwalbe, MD Lamar Blinks, MD Laverda Sorenson, MD Orie Fisherman MD ELECTRONICALLY SIGNED 06/29/2013 15:01

## 2014-12-10 NOTE — Consult Note (Signed)
Chief Complaint:  Subjective/Chief Complaint Pt states improved sob no cp. She feel a little better.   VITAL SIGNS/ANCILLARY NOTES: **Vital Signs.:   02-Nov-14 12:46  Vital Signs Type Routine  Temperature Temperature (F) 98.3  Celsius 36.8  Temperature Source oral  Pulse Pulse 88  Respirations Respirations 18  Systolic BP Systolic BP 409  Diastolic BP (mmHg) Diastolic BP (mmHg) 70  Mean BP 96  Pulse Ox % Pulse Ox % 98  Pulse Ox Activity Level  At rest  Oxygen Delivery 3L  *Intake and Output.:   02-Nov-14 17:15  Grand Totals Intake:  240 Output:      Net:  240 24 Hr.:  -850  Oral Intake      In:  240  Percentage of Meal Eaten  100   Brief Assessment:  GEN well developed, well nourished, no acute distress   Cardiac Regular  murmur present  + LE edema  + JVD   Respiratory normal resp effort  postive use of accessory muscles  rhonchi  crackles   Gastrointestinal Normal   Gastrointestinal details normal Soft  Bowel sounds normal   EXTR negative cyanosis/clubbing   Lab Results: Thyroid:  02-Nov-14 01:05   Thyroid Stimulating Hormone 1.67 (0.45-4.50 (International Unit)  ----------------------- Pregnant patients have  different reference  ranges for TSH:  - - - - - - - - - -  Pregnant, first trimetser:  0.36 - 2.50 uIU/mL)  General Ref:  02-Nov-14 10:40   ANA w/Reflex to 9 Conf.Tests ========== TEST NAME ==========  ========= RESULTS =========  = REFERENCE RANGE =  ANA W/REFLEX-9 CONF.TEST  ANA w/Reflex if Positive ANA Direct                      [   Negative             ]          Negative               [facility]            No: [id number]           [address], [place], [place] [address]           Lindon Romp, MD         [phone]   Result(s) reported on 22 Jun 2013 at 03:57PM.  ANCA Panel ========== TEST NAME ==========  ========= RESULTS =========  = REFERENCE RANGE =  ANCA PANEL  ANCA Panel Antimyeloperoxidase (MPO) Abs    [   <9.0 U/mL            ]           0.0-9.0 Antiproteinase 3 (PR-3) Abs     [   <3.5 U/mL            ]       0.0-3.5 Cytoplasmic (C-ANCA)            [   <1:20 titer          ]         Neg:<1:20 Perinuclear (P-ANCA)            [   <1:20 titer          ]         Neg:<1:20 The presence of positive fluorescence exhibiting P-ANCA or C-ANCA patterns alone is not specific for the diagnosis of Wegener's Granulomatosis (WG) or microscopic polyangiitis. Decisions about treatment should not  be based solely on ANCA IFA results.  The International ANCA Group Consensus recommends follow up testing of positive sera with both PR-3 and MPO-ANCA enzyme immunoassays. As many as 5% serum samples are positive only by EIA. Ref. AM J Clin Pathol 1999;111:507-513. Atypical pANCA                  [   <1:20 titer          ]         Neg:<1:20 The atypical pANCA pattern has been observed in a significant percentage of patients with ulcerative colitis, primary sclerosing cholangitis and autoimmune hepatitis.               [facility]            No: [id number]           [address], [place], [place] [address]           Lindon Romp, MD         [phone]   Result(s) reported on 22 Jun 2013 at 04:49PM.  Antiglomerular Base Membrane ========== TEST NAME ==========  ========= RESULTS =========  = REFERENCE RANGE =  ANTIGLOMERULAR BASE MEM.  Antiglomerular BM Ab Antiglomerular BM Ab            [   3 units              ]              0-20                                   Negative                   0 - 20                                   Weak Positive             21 - 30                                   Moderate to Strong Positive   >30               [facility]            No: [id number]           [address], [place], [place] [address]           Lindon Romp, MD         [phone]   Result(s) reported on 22 Jun 2013 at 04:49PM.  Parathyroid Hormone, Intact ==========  TEST NAME ==========  ========= RESULTS =========  = REFERENCE RANGE =  PTH INTACT  PTH, Intact PTH, Intact                     [H  112 pg/mL            ]             15-65               [facility]            No: [id number]           [address] [place], [place] [address]           Lindon Romp, MD         [phone]   Result(s) reported on 22 Jun 2013 at 03:57PM.  Protein Electrophoresis, Serum ========== TEST NAME ==========  ========= RESULTS =========  = REFERENCE RANGE =  PROTEIN ELECTROPHORESIS  Protein Electro.,S Protein, Total, Serum           [L  5.5 g/dL             ]           6.0-8.5 Albumin                         [   3.4 g/dL             ]           3.2-5.6 Alpha-1-Globulin                [   0.2 g/dL             ]           0.1-0.4 Alpha-2-Globulin                [   0.6 g/dL             ]           0.4-1.2 Beta Globulin                   [   0.8 g/dL             ]           0.6-1.3 Gamma Globulin                  [   0.5 g/dL             ]           0.5-1.6 M-Spike                         [   Not Observed g/dL    ]      Not Observed Globulin, Total                 [   2.1 g/dL             ]           2.0-4.5 A/G Ratio                       [   1.6                  ]           0.7-2.0 Please note:                    [   Final Report         ]                   Protein electrophoresis scan will follow via computer, mail, or courier delivery.             [facility]            No: [id number]           [address], [place], [place] [address]  Lindon Romp, MD         [phone]   Result(s) reported on 22 Jun 2013 at 04:49PM.  Vitamin D, 25-Hydroxy, Serum ========== TEST NAME ==========  ========= RESULTS =========  = REFERENCE RANGE =  VITAMIN D  Vitamin D, 25-Hydroxy Vitamin D, 25-Hydroxy           [L  21.8 ng/mL           ]        30.0-100.0 Vitamin D deficiency has been defined by the [place] practice guideline as a level of serum 25-OH vitamin D less than 20 ng/mL (1,2). The Endocrine Society went on to further define vitamin D insufficiency as a level between 21 and 29 ng/mL (2). 1. IOM (Institute of Medicine). 2010. Dietary reference    intakes for calcium and D. [place]: The    [employer]. 2. Holick MF, Binkley Jewell, Bischoff-Ferrari HA, et al.    Evaluation, treatment, and prevention of vitamin D    deficiency: an Endocrine Society clinical practice    guideline. JCEM. 2011 Jul; 96(7):1911-30.               [facility]            No: [id number]           [address] [place], [place] [address]           Lindon Romp, MD         [phone]   Result(s) reported on 22 Jun 2013 at 04:49PM.  Routine Chem:  02-Nov-14 01:05   Glucose, Serum  110  BUN  33  Creatinine (comp)  2.13  Sodium, Serum 139  Potassium, Serum 4.4  Chloride, Serum  112  CO2, Serum 22  Calcium (Total), Serum 8.5  Anion Gap  5  Osmolality (calc) 285  eGFR (African American)  25  eGFR (Non-African American)  21 (eGFR values <29mL/min/1.73 m2 may be an indication of chronic kidney disease (CKD). Calculated eGFR is useful in patients with stable renal function. The eGFR calculation will not be reliable in acutely ill patients when serum creatinine is changing rapidly. It is not useful in  patients on dialysis. The eGFR calculation may not be applicable to patients at the low and high extremes of body sizes, pregnant women, and vegetarians.)  Result Comment TROPONIN - RESULTS VERIFIED BY REPEAT TESTING.  - ELEVATED TROPONIN PREVIOUSLY CALLED AT  - 1814 06/20/13.PMH  Result(s) reported on 21 Jun 2013 at 01:58AM.  Hemoglobin A1c [facility]) 5.4 (The American Diabetes Association recommends that a primary goal of therapy should be <7% and that physicians should reevaluate the treatment regimen in patients with HbA1c values consistently >8%.)   Cholesterol, Serum 153  Triglycerides, Serum 156  HDL (INHOUSE) 43  VLDL Cholesterol Calculated 31  LDL Cholesterol Calculated 79 (Result(s) reported on 21 Jun 2013 at 01:54AM.)    10:40   Phosphorus, Serum 3.4 (Result(s) reported on 21 Jun 2013 at 11:13AM.)  Cardiac:  02-Nov-14 01:05   CK, Total 130  CPK-MB, Serum  6.4 (Result(s) reported on 21 Jun 2013 at 01:53AM.)  Troponin I  0.54 (0.00-0.05 0.05 ng/mL or less: NEGATIVE  Repeat testing in 3-6 hrs  if clinically indicated. >0.05 ng/mL: POTENTIAL  MYOCARDIAL INJURY. Repeat  testing in 3-6 hrs if  clinically indicated. NOTE: An increase or decrease  of 30% or more on serial  testing suggests a  clinically important  change)  Routine Hem:  02-Nov-14 01:05   WBC (CBC)  11.8  RBC (CBC)  3.15  Hemoglobin (CBC)  8.8  Hematocrit (CBC)  26.9  Platelet Count (CBC) 183  MCV 86  MCH 28.1  MCHC 32.8  RDW 13.5  Neutrophil % 67.4  Lymphocyte % 18.7  Monocyte % 9.8  Eosinophil % 3.6  Basophil % 0.5  Neutrophil #  8.0  Lymphocyte # 2.2  Monocyte #  1.2  Eosinophil # 0.4  Basophil # 0.1 (Result(s) reported on 21 Jun 2013 at 01:29AM.)   Radiology Results: XRay:    01-Nov-14 08:49, Chest Portable Single View  Chest Portable Single View   REASON FOR EXAM:    Shortness of Breath  COMMENTS:       PROCEDURE: DXR - DXR PORTABLE CHEST SINGLE VIEW  - Jun 20 2013  8:49AM     RESULT: Comparison is made to the study of 06/28/2010.    The cardiac silhouette is enlarged. There is diffuse interstitial edema.   Density is more prominent in the mid left lung laterally and in the right   lung base. Bilateral pneumonia is not excluded. There is no significant   effusion. Atherosclerotic calcification is present.    IMPRESSION:   1. Cardiomegaly with asymmetric pulmonary edema as described. Bilateral   infiltrates or not excluded. Followup is recommended.  Dictation Site: 6        Verified By: Sundra Aland, M.D., MD     [phone] 09:54, Chest PA and Lateral  Chest PA and Lateral   REASON FOR EXAM:    hypoxia. CHF f/u  COMMENTS:       PROCEDURE: DXR - DXR CHEST PA (OR AP) AND LATERAL  - Jun 21 2013  9:54AM     RESULT: Comparison is made to the study of 06/20/2013.    Small bilateral effusions are present. There is evidence of mild   interstitial edema with some peribronchial cuffing. Minimal right lung   base atelectasis appears present. There is no pneumothorax.    IMPRESSION:  Small bilateral pleural effusions with changes suggestive of   mild interstitial edema.    Dictation Site: 1    Verified By: Sundra Aland, M.D., MD  Cardiology:    01-Nov-14 08:33, ED ECG  Ventricular Rate 119  Atrial Rate 119  P-R Interval 160  QRS Duration 140  QT 356  QTc 500  P Axis 57  R Axis 13  T Axis 169  ECG interpretation   Sinus tachycardia  Left bundle branch block  Abnormal ECG  When compared with ECG of 18-Nov-2012 15:08,  Vent. rate has increased BY  49 BPM  T wave inversion now evident in Inferior leads  T wave inversion more evident in Lateral leads  ----------unconfirmed----------  Confirmed by OVERREAD, NOT ([id number]), editor PEARSON, BARBARA ([id number]) on 06/22/2013 9:19:57 AM  ED ECG     01-Nov-14 15:14, Echo Doppler  Echo Doppler   REASON FOR EXAM:      COMMENTS:       PROCEDURE: [place] - ECHO DOPPLER COMPLETE(TRANSTHOR)  - Jun 20 2013  3:14PM     RESULT: Echocardiogram Report    Patient Name:   Bailey Ramirez Date of Exam: 06/20/2013  Medical Rec #:  [id number]      Custom1:  Date of Birth:  03-Dec-1928  Height:       62.0 in  Patient Age:    81 years  Weight:       210.0 lb  Patient Gender: F           BSA:          1.95 m??    Indications: CHF  Sonographer:    LTM  Referring Phys: Hillary Bow, R    Summary:   1. Left ventricularejection fraction, by visual estimation, is 45 to   50%.   2. Normal global left ventricular systolic function.   3. Mildly dilated left atrium.   4. Mild  mitral valve regurgitation.   5. Mild thickening of the anterior and posterior mitral valve leaflets.   6. Mildly elevated pulmonary artery systolic pressure.   7. Mildly increased left ventricular posterior wall thickness.  2D AND M-MODE MEASUREMENTS (normal ranges within parentheses):  Left Ventricle:          Normal  IVSd (2D):      1.49 cm (0.7-1.1)  LVPWd (2D):     1.29 cm (0.7-1.1) Aorta/LA:                  Normal  LVIDd (2D):     4.36 cm (3.4-5.7) Left Atrium (2D): 4.30 cm (1.9-4.0)  LVIDs (2D):     3.27 cm  LV FS (2D):     25.0 %   (>25%)  LV EF (2D):     49.7 %   (>50%)   Right Ventricle:                                    RVd (2D):  LV DIASTOLIC FUNCTION:  MV Peak E: 1.17 m/s Decel Time: 332 msec  MV Peak A: 1.53 m/s  E/A Ratio: 0.76  SPECTRAL DOPPLER ANALYSIS (where applicable):  Mitral Valve:  MV Max Vel:   1.92 m/s MV P1/2Time: 96.28 msec  MV Mean Grad: 5.0 mmHg MV Area, PHT: 2.29 cm??  Aortic Valve: AoV Max Vel: 1.47 m/s AoV Peak PG: 8.6 mmHg AoV Mean PG:   5.0 mmHg  LVOT Vmax: 0.94 m/s LVOT VTI:  LVOT Diameter: 2.20 cm  AoV Area, Vmax: 2.44 cm?? AoV Area, VTI:  AoV Area, Vmn:  Tricuspid Valve and PA/RV Systolic Pressure: TR Max Velocity: 2.88 m/s RA   Pressure: 10 mmHg RVSP/PASP: 43.1 mmHg  Pulmonic Valve:  PV Max Velocity: 1.16 m/s PV Max PG: 5.4 mmHg PV Mean PG:    PHYSICIAN INTERPRETATION:  Left Ventricle: The left ventricular internal cavity size was normal. LV   septal wall thickness was normal. LV posterior wall thickness was mildly   increased. Global LV systolic function was normal. Left ventricular   ejection fraction, by visual estimation, is 45 to 50%.  Right Ventricle: The right ventricular size is normal. Global RV systolic   function is low normal.  Left Atrium: The left atrium is mildly dilated.  Right Atrium: The right atrium is normal in size.  Pericardium: There is no evidence of pericardial effusion.  Mitral Valve: The mitral valve is normal  in structure. There is mild   thickening of the anterior and posterior mitral valve leaflets. Mild   mitral valve regurgitation is seen.  Tricuspid Valve: The tricuspid valve is normal. Trivial tricuspid   regurgitation is visualized. The tricuspid regurgitant velocity is 2.88   m/s, and with an assumed right atrial pressure of 10 mmHg, the estimated   right ventricular systolic pressure is mildly elevated at 43.1 mmHg.  Aortic Valve: The aortic valve is normal. The aortic valve is   structurally normal, with no evidence of sclerosis or stenosis.  Pulmonic Valve: The pulmonic valve is normal.    Harper MD  Electronically signed by Faulk MD  Signature Date/Time: 06/22/2013/5:39:20 AM    *** Final ***    IMPRESSION: .        Verified By: Yolonda Kida, M.D., MD   Assessment/Plan:  Assessment/Plan:  Assessment IMP CHF COPD ARI/CRI Obesity GERD Anemia .   Plan PLAN Lasix IV 02 Bipap as necessary Inhalers  Consider steroids Antibx for bronchitis Nephology consult Consider pulmonary input CM Hold off on ACEbecause of CRI U/S of renals   Electronic Signatures: Lujean Amel D (MD)  (Signed [phone] 21:07)  Authored: Chief Complaint, VITAL SIGNS/ANCILLARY NOTES, Brief Assessment, Lab Results, Radiology Results, Assessment/Plan   Last Updated: 04-Nov-14 21:07 by Lujean Amel D (MD)

## 2014-12-10 NOTE — Op Note (Signed)
PATIENT NAME:  Bailey FreibergOWEN, Rukaya E MR#:  [id number] DATE OF BIRTH:  April 13, 1929  DATE OF PROCEDURE:  01/13/2013  PREOPERATIVE DIAGNOSIS: Visually significant cataract of the left eye.   POSTOPERATIVE DIAGNOSIS: Visually significant cataract of the left eye.   OPERATIVE PROCEDURE: Cataract extraction by phacoemulsification with implant of intraocular lens to left eye.   SURGEON: Galen ManilaWilliam Eryanna Regal, MD.   ANESTHESIA:  1. Managed anesthesia care.  2. Topical tetracaine drops followed by 2% Xylocaine jelly applied in the preoperative holding area.   COMPLICATIONS: None.   TECHNIQUE:  Stop and chop.   DESCRIPTION OF PROCEDURE: The patient was examined and consented in the preoperative holding area where the aforementioned topical anesthesia was applied to the left eye and then brought back to the Operating Room where the left eye was prepped and draped in the usual sterile ophthalmic fashion and a lid speculum was placed. A paracentesis was created with the side port blade and the anterior chamber was filled with viscoelastic. A near clear corneal incision was performed with the steel keratome. A continuous curvilinear capsulorrhexis was performed with a cystotome followed by the capsulorrhexis forceps. Hydrodissection and hydrodelineation were carried out with BSS on a blunt cannula. The lens was removed in a stop and chop technique and the remaining cortical material was removed with the irrigation-aspiration handpiece. The capsular bag was inflated with viscoelastic and the Tecnis ZCB00 20.5-diopter lens, serial number [id number] was placed in the capsular bag without complication. The remaining viscoelastic was removed from the eye with the irrigation-aspiration handpiece. The wounds were hydrated. The anterior chamber was flushed with Miostat and the eye was inflated to physiologic pressure. 0.1 mL of cefuroxime concentration 10 mg/mL was placed in the anterior chamber. The wounds were found to be water  tight. The eye was dressed with Vigamox. The patient was given protective glasses to wear throughout the day and a shield with which to sleep tonight. The patient was also given drops with which to begin a drop regimen today and will follow-up with me in 1 day.    ____________________________ Jerilee FieldWilliam L. Glennys Schorsch, MD wlp:aw D: 01/13/2013 12:38:31 ET T: 01/13/2013 14:41:16 ET JOB#: [id number]  cc: Saarah Dewing L. Ramonia Mcclaran, MD, <Dictator> Jerilee FieldWILLIAM L Ski Polich MD ELECTRONICALLY SIGNED 01/15/2013 17:58

## 2014-12-10 NOTE — Consult Note (Signed)
PATIENT NAME:  Bailey Ramirez, Bailey Ramirez MR#:  [id number] DATE OF BIRTH:  11/18/1928  DATE OF CONSULTATION:  06/21/2013   CONSULTING PHYSICIAN:  Munsoor N. Lateef, MD REQUESTING PHYSICIAN: Dr. Sudini.   REASON FOR CONSULTATION: Acute renal failure with known chronic kidney disease stage 4.   HISTORY OF PRESENT ILLNESS: The patient is a very pleasant 81-year-old Caucasian female with past medical history of hypertension, hyperlipidemia, osteoarthritis status post right knee replacement, lumbar spinal stenosis, pernicious anemia, asthma, anxiety, and depression, who presented to [facility] with increasing shortness of breath. She reports that her shortness of breath has been progressive over the past week. Minimal exertion would cause dyspnea. She also had increasing lower extremity edema. She was recently taken off of lisinopril/hydrochlorothiazide as an outpatient. She was scheduled to see us in the office tomorrow; however, had worsening shortness of breath and was admitted to [facility]. When she presented she had a creatinine of 1.74 with an eGFR of 27. However, her renal function is now worsened after starting Lasix and creatinine is up to 2.1. She diuresed approximately 3.3 liters yesterday.   PAST MEDICAL HISTORY: 1. Hypertension.  2. Hyperlipidemia.  3. Osteoarthritis status post right knee replacement.  4. Lumbar spinal stenosis.  5. Pernicious anemia.  6. Asthma.  7. Depression.  8. Anxiety.  9. GERD.   PAST SURGICAL HISTORY:  1. Ruptured disk repair in 1999.  2. Abdominal hysterectomy 1984.  3. Tonsillectomy as a child.  4. Right total knee replacement in 2011 by Dr. Menz.   ALLERGIES: MELOXICAM WHICH CAUSED LEG SWELLING. PHENERGAN, WHICH CAUSES ALTERED MENTAL STATUS, AND SULFAS, WHICH CAUSES RASH.   CURRENT INPATIENT MEDICATIONS:  Tylenol 650 mg p.o. every four hours, Norco 5/325 1 tablet p.o. q.4h. p.r.n. pain. Coreg 3.125 mg p.o. b.i.d., Celexa  40 mg p.o. daily, diclofenac gel 2 grams topical 4 times daily, Colace 100 mg p.o. b.i.d., Advair 100/50, 1 puff inhaled b.i.d., heparin 5000 units subcutaneous q.8 hours. Hydralazine 10 mg IV every four hours p.r.n., omeprazole 40 mg p.o. every 6:00 a.m. Zofran 4 mg IV every four hours p.r.n. Lasix 40 mg IV b.i.d.   SOCIAL HISTORY: The patient resides at [facility]. Her primary care physician there is Dr. Letvak. She used to smoke but quit in the 1980s. Denies alcohol, illicit drug use.   FAMILY HISTORY: The patient's mother deceased and had history of lung cancer. The patient's father is deceased and had a history of COPD.   REVIEW OF SYSTEMS:  CONSTITUTIONAL: Denies fevers, chills, weight loss.  EYES: Denies diplopia, blurry vision.  HEENT: Denies headaches, hearing loss. Denies epistaxis.  CARDIOVASCULAR: Denies chest pain, palpitations.  RESPIRATORY: Endorses cough as well as shortness of breath with dyspnea with exertion.  GASTROINTESTINAL: Denies nausea, vomiting, diarrhea.  GENITOURINARY: Denies frequency, urgency, or dysuria.  MUSCULOSKELETAL: Reports lower back pain and a history of right knee pain.  INTEGUMENTARY: Denies skin rashes or lesions.  NEUROLOGIC: Denies focal extremity numbness, weakness, or tingling.  PSYCHIATRIC: Has history of anxiety and depression.  ENDOCRINE: Denies polyuria, polydipsia.  HEMATOLOGIC AND LYMPHATIC: Denies easy bruisability, bleeding, or swollen lymph nodes.  ALLERGY/IMMUNOLOGIC: Denies seasonal allergies or history of immunodeficiency.   PHYSICAL EXAMINATION: VITAL SIGNS: Temperature 98.1, pulse 92, respirations 18, blood pressure 162/75.  GENERAL: Well-developed, well-nourished, obese female, currently in no acute distress.   HEENT: Normocephalic, atraumatic. Extraocular movements are intact. Pupils equal, round and reactive to light. No scleral icterus. Conjunctivae are pink. No epistaxis noted.   Gross hearing intact. Oral mucosa moist.  NECK:  Supple without JVD or lymphadenopathy.  LUNGS: Demonstrate bilateral wheezing and basilar rales with normal respiratory effort.  HEART: S1, S2 regular rate and rhythm. No murmurs, rubs, or gallops appreciated.  ABDOMEN: Obese, soft, nontender, nondistended. Bowel sounds positive. No rebound or guarding. No gross organomegaly appreciated.  EXTREMITIES: No clubbing or cyanosis noted. 1+ bilateral lower extremity edema noted.  NEUROLOGIC: The patient is alert and oriented to time, person, and place. Strength is five out of five in both upper and lower extremities.  SKIN: Warm and dry. No rashes noted.  MUSCULOSKELETAL: No joint redness, swelling or tenderness appreciated.  GENITOURINARY: No suprapubic tenderness is noted at this time.  PSYCHIATRIC: The patient has an appropriate affect and appears to have good insight into her current illness.   LABORATORY, DIAGNOSTIC AND RADIOLOGIC DATA: CMP upon admission showed a sodium 140, potassium 3.9, chloride 112, CO2 18, BUN 34, creatinine 1.74, glucose 203, total protein 7.1, albumin 3.6, total bilirubin 0.3, alkaline phosphatase 72, AST 83, ALT 43. Repeat labs this morning shows sodium 139, potassium 4.4, chloride 112, CO2 22, BUN 33, creatinine 2.13, glucose 110. Troponin is 0.54. TSH is 1.67. CBC shows WBC 11.8, hemoglobin 8.8, hematocrit 26.9, platelets 183, MCV 86. Blood cultures x 2 sets are negative. Urinalysis shows urine protein 100 mg/dL, 3 RBCs per high-power field, 4 WBCs per high-power field. ABG shows pH of 7.21, pCO2 44, pO2 of 132.   IMPRESSION: This is an 81-year-old Caucasian female with past medical history of hypertension, hyperlipidemia, osteoarthritis of the right knee status post right knee replacement, lumbar spinal stenosis, pernicious anemia, asthma, anxiety and depression presented to [facility] with worsening shortness of breath over a one-week. The patient also has known underlying chronic kidney disease, stage  4, with baseline eGFR of 27.  1. Acute renal failure/chronic kidney disease, stage 4/proteinuria. The patient's presenting creatinine was 1.7 with an eGFR of 27. This is similar to labs seen at [facility]. Her creatinine now is worse at 2.13 with an eGFR of 21. Suspect that the Lasix she was started on has led to an element of intravascular volume depletion. However, she does appear to be in need of Lasix. We will discontinue IV Lasix and convert her to p.o. Lasix 40 mg p.o. daily. We will also proceed with a renal ultrasound as well as serologic work-up including SPEP, UPEP, ANA, ANCA antibodies, GBM antibodies, urine for eosinophils. We will also further quantify her proteinuria and check a urine protein to creatinine ratio.  2. Anemia, not otherwise specified. The patient could have anemia of chronic kidney disease. We will check SPEP and UPEP as above as well as serum iron studies. We will hold off for Epogen for now.  3. Shortness of breath. This appears to be multifactorial. She could have underlying congestive heart failure. A 2-D echocardiogram is currently pending. We have reduced Lasix to 40 mg p.o. daily in place of Lasix 40 mg IV b.i.d. since her renal function is worsening. Would recommend consideration of CT scan of the chest without contrast as well to look for other etiologies.  4. I would like to thank Dr. Sudini for this kind referral. Further plan as the patient progresses.   ____________________________ Munsoor N. Lateef, MD mnl:sg D: 06/21/2013 09:12:00 ET T: 06/21/2013 10:01:37 ET JOB#: [id number]  cc: Munsoor N. Lateef, MD, <Dictator> MUNSOOR N LATEEF MD ELECTRONICALLY SIGNED 07/08/2013 10:07 

## 2014-12-10 NOTE — Discharge Summary (Signed)
PATIENT NAME:  Bailey FreibergOWEN, Bailey Ramirez MR#:  [id number] DATE OF BIRTH:  09-07-1928  DATE OF ADMISSION:  06/20/2013 DATE OF DISCHARGE:  06/24/2013  DISCHARGE DIAGNOSES: 1. Acute onset new diastolic heart failure with ejection fraction of 40% to 50% on echocardiogram. -7.2 liters fluid balance with good diuresis in the hospital.  2. Acute respiratory failure, now close to baseline not requiring oxygen.  3. Elevated troponin, likely from heart failure in the setting of chronic kidney disease.  4. Accelerated hypertension, now resolved.  5. Acute on chronic kidney disease stage IV, close to baseline. 6. Stable asthma. 7. Normocytic anemia, likely from chronic disease.   SECONDARY DIAGNOSES: 1. Hypertension.  2. Diet controlled diabetes.  3. Asthma. 4. Pernicious anemia.  5. Lumbar spinal stenosis.  6. Hyperlipidemia.  7. Anxiety and depression.  8. Degenerative joint disease.   CONSULTATION:  1. Cardiology, Dr. Juliann Paresallwood.  2. Nephrology, Dr. Thedore MinsSingh.  3. Physical and occupational therapy.   PROCEDURES AND RADIOLOGY: Chest x-ray on 11/01 showed cardiomegaly with asymmetric pulmonary edema. Bilateral infiltrates, not excluded.   Chest x-ray on 11/02 showed small bilateral pleural effusion with mild interstitial edema   Bilateral kidney ultrasound on 11/03 was unremarkable.   A 2-D echo on 11/01 showed normal LV systolic function with ejection fraction of 45% to 50%. Mildly dilated left atrium. Mildly elevated pulmonary artery systolic pressure. Mildly increased LV posterior wall thickness. Mild thickening of anterior and posterior mitral valve leaflets. Mild mitral vault regurgitation.   Urinalysis on admission showed trace bacteria, 4 WBCs, otherwise negative.   Blood cultures x 2 were negative on 11/01.   Serum ANA was negative. Serum ANCA panel was negative. anti-glomerular basement membrane antibody was within normal limits. Intact PTH was elevated with a value of 112. Serum protein  electrophoresis was within normal limits, except low total protein. Serum vitamin D level was borderline low.  HISTORY AND SHORT HOSPITAL COURSE: The patient is an 81 year old female with the above-mentioned medical problems who was admitted for acute CHF along with acute respiratory failure. Please see Dr. Eddie Ramirez's dictated history and physical for further details. The patient was evaluated by cardiology, Dr. Juliann Paresallwood, who recommended 2-D echo, which was performed with results dictated above. The patient was also found to have acute on chronic kidney disease stage IV, for which he was monitored by nephrology, Dr. Thedore MinsSingh. The patient was aggressively diuresed with IV Lasix and had a fairly good response to dialysis with more than 7 liters of fluid out. The patient was feeling very well and was evaluated by physical and occupational therapy and was recommended short-term rehab as she is being discharged, as she is feeling close to her baseline, not requiring any oxygen.   VITAL SIGNS: On the date of discharge, her vital signs are as follows: Temperature 98.5, heart rate 85 per minute, respirations 18 per minute, blood pressure 128/72,  mm hg  she was saturating 94% on room air.   PERTINENT PHYSICAL EXAMINATION ON THE DATE OF DISCHARGE:  CARDIOVASCULAR: S1, S2 normal. No murmurs, rubs, gallops.  LUNGS: Clear to auscultation bilaterally. No wheezing, rales, rhonchi or crepitation   ABDOMEN: Soft, benign.  NEUROLOGIC: Nonfocal examination.   All other physical examination remained at baseline.   DISCHARGE MEDICATIONS: 1. One-A-Day multivitamin once daily.  2. Proventil 2 puffs inhaled 4 times a day as needed.  3. Citalopram 40 mg p.o. daily.  4. Premarin vaginal 0.625 mg one application vaginal area 2 times a day.  5. Nystatin topical to  affected area 3 times a day as needed.  6. Omeprazole 40 mg p.o. daily.  7. Tylenol 650 mg p.o. 3 times a day. 8. Proctosol-hc  2.5% topical cream to affected area  twice a day.  9. Coreg 3.125 mg p.o. b.i.d.  10. Lasix 20 mg p.o. daily.   11. Colace 100 mg p.o. b.i.d. as needed.   DISCHARGE DIET: Renal diet.   DISCHARGE ACTIVITY: As tolerated.   DISCHARGE INSTRUCTIONS AND FOLLOW-UP: The patient was instructed to follow up with her primary care physician, Dr. Tillman Abide in 1 to 2 weeks. She will need follow-up with Dr. Luther Redo from nephrology in 2 to 4 weeks. She will get physical therapy evaluation and management while at the facility.   TOTAL TIME DISCHARGING THIS PATIENT: 45 minutes.    ____________________________ Ellamae Sia. Sherryll Burger, MD vss:sg D: 06/24/2013 10:55:51 ET T: 06/24/2013 11:22:16 ET JOB#: [id number]  cc: Analis Distler S. Sherryll Burger, MD, <Dictator> Bailey Schwalbe, MD Dwayne D. Juliann Pares, MD Laverda Sorenson, MD  Patricia Pesa MD ELECTRONICALLY SIGNED 06/24/2013 17:22

## 2014-12-10 NOTE — Consult Note (Signed)
PATIENT NAME:  Bailey Ramirez, Bailey Ramirez DATE OF BIRTH:  [birth year]  DATE OF CONSULTATION:  06/20/2013  REFERRING PHYSICIAN:  Dr. Margarita GrizzleWoodruff from the Emergency Room. CONSULTING PHYSICIAN:  Rylea Selway D. Deriana Vanderhoef, MD  The patient has seen Dr. Gwen PoundsKowalski in the past.   CHIEF COMPLAINT: Shortness of breath.   HISTORY OF PRESENT ILLNESS: The patient is an 81 year old white female resident of an independent living facility who complains of asthma, hypertension, not on home O2, complained of acute shortness of breath. The patient states that she was doing well until yesterday when she nursing staff at independent living, told them she was short of breath and to call rescue. When they got there, she was found that her sats were 60%. The patient was extremely short of breath. The patient's chest x-ray showed pulmonary edema, placed on BiPAP and admitted for further evaluation for heart failure. She has had some orthopnea and lower extremity edema, which are chronic. Complains of cough, which is nonproductive. Had cardiac evaluation about 10 years ago with Dr. Gwen PoundsKowalski, none since.   PAST MEDICAL HISTORY: Hypertension, diabetes, asthma, pernicious anemia, lumbar stenosis, hyperlipidemia, anxiety, depression, degenerative joint disease.   PAST SURGICAL HISTORY: Tonsillectomy, hysterectomy, ruptured disk.   FAMILY HISTORY: Emphysema, lung cancer.   SOCIAL HISTORY: Widowed. Two children. Former smoker, quit in 1988.   ALLERGIES: PHENERGAN, SULFA, MELOXICAM.   REVIEW OF SYSTEMS: Difficult to assess. She is on BiPAP, short of breath.   MEDICATIONS: She is on amlodipine 5 a day, citalopram 40 a day, nystatin, omeprazole 40 a day, multivitamin, Premarin, Proventil p.r.n.   PHYSICAL EXAMINATION: VITAL SIGNS: Blood pressure was 200/100, pulse of 100, respiratory rate of 20, BIPAP, 98% sat.  GENERAL: Appears to be in respiratory distress.  HEENT: Normocephalic, atraumatic. Pupils equal and reactive to light.   NECK: Supple.   She had significant JVD bilaterally.  LUNGS: Bilateral rhonchi and rales in the bases.  HEART: Regular rate and rhythm, tachycardic. Systolic ejection murmur at the left sternal border.  ABDOMEN: Benign.  EXTREMITIES: Within normal limits.  NEUROLOGIC: Intact.  SKIN: Normal.   LABORATORIES: BNP 2500. BUN of 35, potassium 1.7, chloride of 40,  sodium of 140, potassium 3.9, chloride 112, bicarbonate of 18, lactic acid 0.8. A pH 7.21, pO2 132, CO2 44. White count of 10.9, hemoglobin of 10.8. Troponin was negative.  EKG: Left bundle branch block, NSSTWave. Chest x-ray: Pulmonary edema with pleural effusions, otherwise negative.   ASSESSMENT: Congestive heart failure, malignant hypertension, respiratory failure, renal insufficiency, acidosis, gastroesophageal reflux disease.   PLAN: Agree with admit. Rule out for myocardial infarction and treat for heart failure, treat with respiratory support. Continue BiPAP as necessary. Continue diuresis with Lasix as necessary. Continue aggressive medical therapy. Would recommend followup for renal failure. Continue inhalers as necessary. Continue DVT prophylaxis. Followup diabetes, continue lipid management.  Hopefully, the patient can diurese. We will follow up with echocardiogram and control accelerated hypertension with labetalol and maybe Procardia, and base further evaluation on results. Make sure she rules out for myocardial infarction. Continue aggressive respiratory support and see if further cardiology input would be necessary.   ____________________________ Bobbie Stackwayne D. Juliann Paresallwood, MD ddc:sg D: 06/22/2013 09:19:00 ET T: 06/22/2013 10:21:55 ET JOB#: [id number]  cc: Talise Sligh D. Juliann Paresallwood, MD, <Dictator> Alwyn PeaWAYNE D Naji Mehringer MD ELECTRONICALLY SIGNED 07/27/2013 9:34

## 2014-12-11 NOTE — Op Note (Signed)
PATIENT NAME:  Bailey Ramirez, Bailey Ramirez MR#:  [id number] DATE OF BIRTH:  01-Nov-1928  DATE OF PROCEDURE:  02/13/2014  DATE OF BIRTH: 11/05/1962  PREOPERATIVE DIAGNOSIS:    1.  Chronic kidney disease with acute renal failure, volume overload, and need for hemodialysis. The patient has pulled her previous dialysis catheter.  2.  Asthma.  3.  Diabetes.  4.  Hypertension.    POSTOPERATIVE DIAGNOSIS:   1.  Chronic kidney disease with acute renal failure, volume overload, and need for hemodialysis. The patient has pulled her previous dialysis catheter.  2.  Asthma.  3.  Diabetes.  4.  Hypertension.    PROCEDURES:  1. Ultrasound guidance for vascular access to the left femoral vein.  2. Placement of non-tunneled dialysis catheter into the left femoral vein.  SURGEON: Annice NeedyJason S. Dew, MD   ANESTHESIA: Local.   BLOOD LOSS: Minimal.   INDICATION FOR PROCEDURE: This 81 year old female has been in the hospital for over 10 days. She had a dialysis catheter placed yesterday and successfully got hemodialysis. Her azotemia has improved, but she is still confused and has volume overload, and she pulled her other dialysis catheter out. We are asked to place a new dialysis catheter. Risks and benefits were discussed with the daughter. Informed consent was obtained.   DESCRIPTION OF THE PROCEDURE: The patient's left groin was sterilely prepped and draped and a sterile surgical field was created. The left femoral vein was visualized with ultrasound and found to be patent. It was then accessed under direct ultrasound guidance without difficulty with a Seldinger needle and a permanent image was recorded. A J-wire was placed after skin nick and dilatation. The 30 cm Trialysis catheter was then placed over the wire and the wire was removed. All lumens withdrew blood well and flushed easily with sterile saline. It was secured to the skin with 3 nylon sutures. A sterile dressing was placed. The patient tolerated the procedure well  and remained in his bed in a stable condition.     ____________________________ Annice NeedyJason S. Dew, MD jsd:cg D: 02/13/2014 14:57:00 ET T: 02/14/2014 04:56:27 ET JOB#: [id number]  cc: Annice NeedyJason S. Dew, MD, <Dictator> Annice NeedyJASON S DEW MD ELECTRONICALLY SIGNED 02/22/2014 15:46

## 2014-12-11 NOTE — Discharge Summary (Signed)
PATIENT NAME:  Bailey Ramirez, Bailey Ramirez MR#:  [id number] DATE OF BIRTH:  1928-12-28  DATE OF ADMISSION:  02/02/2014 DATE OF DISCHARGE:  02/18/2014   ADMISSION DIAGNOSIS: Acute on chronic congestive heart failure.   DISCHARGE DIAGNOSES:  1. Acute renal failure on chronic kidney disease stage III secondary to cardiorenal syndrome/acute tubular necrosis with overdiuresis. Discharge creatinine is 1.8 with a GFR of 29.  2. Left bundle branch block. 3. Acute on chronic diastolic heart failure.  4. Accelerated hypertension.  5. Non-Q-wave myocardial infarction.  6. Back pain.  7. Anemia of chronic disease.  8. Acute respiratory failure due to congestive heart failure and bronchitis. 9. Delirium from renal failure and prolonged hospitalization.  DIAGNOSTIC DATA: White blood cells at discharge 10.8, hemoglobin 10, hematocrit 31, platelets are 250.  Sodium 133, potassium 3.9, chloride 96, bicarbonate 28, BUN 34, creatinine 1.87.  CT of the head showed no acute intracranial hemorrhage or CVA. She does have chronic right MCA bifurcation aneurysm.   CONSULTATIONS:  1. Neurology.  2. Nephrology.  [facility]. [facility].   HOSPITAL COURSE: An 81 year old female who initially presented with acute on chronic renal failure. Subsequently during the hospitalization, had acute on chronic renal failure and delirium. For further details, please refer to the H and P. Also please refer to the dictated interim summary on 02/07/2014. This will cover from then until 02/18/2014.   1. Acute on chronic renal failure stage III. The patient had worsening creatinine secondary to cardiorenal syndrome with acute tubular necrosis and overdiuresis. She actually did require dialysis. Dr. Wyn Quakerew was consulted to place a femoral catheter for use of hemodialysis. She received 2 treatments of hemodialysis while in the hospital. Her kidneys have picked up on their own, and now she is urinating well and creatinine has improved, with a GFR  of 29 at discharge creatinine of 1.8. Appreciate nephrology consultation. Her left femoral catheter has been removed.  2. Left bundle branch block, which is old.  3. Acute on chronic diastolic heart failure. The patient will need p.r.n. Lasix if weight is greater than 93 kg. Discussed this with Dr. Thedore MinsSingh. The patient will also [facility] as an [facility]. 4. Accelerated hypertension, which improved.  5. Non-Q-wave myocardial infarction with elevated troponin due to demand ischemia. The patient was on aspirin, Plavix, Coreg, but due to low heart rates, her Coreg has been stopped. Her echo was performed in the past which showed diastolic dysfunction, normal EF. 6. Back pain. X-ray showed no acute findings. She was on Lidoderm patch.  7. Acute on chronic anemia, status post 3 units of PRBCs. Her hemoglobin has now remained stable.  8. Acute respiratory failure due to congestive heart failure and bronchitis, which has improved. 9. Delirium. The patient did have delirium from her renal failure as well as prolonged hospitalization. She was placed on p.r.n. Risperdal and melatonin at night. Neurology was consulted. Her head CT was negative for acute changes. She is actually now back to normal at her baseline.   DISCHARGE MEDICATIONS:  1. One-A-Day vitamin daily.  2. Proventil 2 puffs 4 times a day as needed.  3. Citalopram 40 mg daily.  4. Tylenol 650 t.i.d.   5. Plavix 75 mg daily.  6. Calcarb 600/400 daily. 7. Protonix 40 mg daily.  8. Acetaminophen/hydrocodone 1/2 tablet to 1 tablet q.6 hours p.r.n.  9. Lasix 40 mg p.r.n. if weight is greater than 93 kg.  10. Sodium bicarbonate 650 b.i.d.  11. Nitroglycerin 0.6 mg  topical daily.  12. Norvasc 10 mg daily. 13. Lidocaine patch daily.  14. Melatonin 3 mg at bedtime for 10 days.  15. Aspirin 81 mg daily.  16. Zocor 10 mg daily.   DISCHARGE DIET: Low sodium diet.   DISCHARGE ACTIVITY: As tolerated.   DISCHARGE REFERRAL:  Physical therapy.   DISCHARGE FOLLOWUP: The patient will follow up with [facility] in 1 week, nephrology in 1 week as well as Dr. Sullivan Lone in 1 week.   TIME SPENT: Approximately 45 minutes on discharge.   Plan of care was discussed with the patient and daughter.  CONDITION: The patient is medically stable for discharge.   ____________________________ Ramadan Couey P. Juliene Pina, MD spm:lb D: 02/18/2014 10:44:54 ET T: 02/18/2014 10:55:41 ET JOB#: [id number]  cc: Sundeep Cary P. Juliene Pina, MD, <Dictator> Richard L. Sullivan Lone, MD [facility] Mosetta Pigeon, MD Janyth Contes Kadesha Virrueta MD ELECTRONICALLY SIGNED 02/18/2014 12:14

## 2014-12-11 NOTE — H&P (Signed)
PATIENT NAME:  Bailey Ramirez, Bailey Ramirez MR#:  [id number] DATE OF BIRTH:  February 05, 1929  DATE OF ADMISSION:  02/02/2014  PRIMARY CARE PROVIDER: Karie Schwalbe, MD  EMERGENCY DEPARTMENT REFERRING PHYSICIAN: Humberto Leep. Margarita Grizzle, MD  CHIEF COMPLAINT: Shortness of breath, accelerated hypertension.    HISTORY OF PRESENT ILLNESS: The patient is an 81 year old white female with history of asthma, hypertension, CHF, who was last hospitalized with acute respiratory failure in November 2014, who apparently has been having diarrhea for the past few weeks. She normally resides in independent living, but had to be moved to skilled care at [facility]. Today, she was going to be discharged to her independent living; however, she developed acute onset of shortness of breath. The patient, when she arrived here at the ED, was tachypneic and tachycardic and had to be placed on a BiPAP. Her blood pressure was 202/114 and had to be started on a nitroglycerin drip. The patient reports that the diarrhea has stopped now. She does have chronic dry cough, which is unchanged. She is not sure if she had fever earlier today. Has not had any chest pains or palpitations or shortness of breath. Was acute onset; it started all of a sudden this morning. Does have some dyspnea on exertion. Uses 1 pillow at nighttime. Denies any swelling in her lower extremities. Otherwise, denies any nausea, vomiting or urinary symptoms.   PAST MEDICAL HISTORY:  1. History of hypertension.  2. Diet-controlled diabetes.  3. History of asthma.  4. History of pernicious anemia.  5. History of diastolic CHF.  6. History of lumbar spinal stenosis.  7. Hyperlipidemia.  8. Anxiety and depression.  9. Degenerative arthritis.  10. History of CVA affecting left eye, has been followed outpatient by neurology.   PAST SURGICAL HISTORY: Status post tonsillectomy, hysterectomy, ruptured disk, status post right knee replacement.   ALLERGIES: PHENERGAN, SULFA, MELOXICAM.    SOCIAL HISTORY: Used to smoke, quit in 1988.   FAMILY HISTORY: Father died of emphysema. Mother died of lung cancer.   HOME MEDICATIONS: She is on:  1. Voltaren topically 1% to affected area as needed. 2. Proventil 2 puffs 4 times a day. 3. Protonix 40 daily. 4. Premarin vaginal 0.625 mg/gram 1 application vaginally t.i.d. 5. Plavix 75 p.o. daily.  6. One A Day women's vitamin daily.  7. Lasix 20 one tab p.o. daily.  8. Citalopram 40 daily.  9. Cholestyramine 1 dose once a day.  10. Carvedilol 6.25 one tab p.o. b.i.d. 11. Calcium carbonate with vitamin D 1 tab p.o. daily.  12. Acetaminophen/hydrocodone 325/5 mg 0.5 to 1 tab q.6 p.r.n. 13. Tylenol 650 q.6 p.r.n.   REVIEW OF SYSTEMS:  CONSTITUTIONAL: Denies any pain. No weight loss. No weight gain.  EYES: No blurred or double vision. No pain. No redness.  ENT: No tinnitus. No ear pain. No hearing loss. No seasonal or year-round allergies. No epistaxis. Complains of chronic cough. No difficulty swallowing.  RESPIRATORY: Complains of dry cough, wheezing earlier. No hemoptysis. Complains of dyspnea. No painful respiration. History of asthma.  CARDIOVASCULAR: Denies any chest pain. No orthopnea. No edema. No arrhythmia. Complains of dyspnea on exertion. No palpitation. No syncope.  GASTROINTESTINAL: No nausea, vomiting. Did have diarrhea, but that has resolved. No abdominal pain. No hematemesis. No melena. Does have a history of GERD.  GENITOURINARY: Denies any dysuria, hematuria, renal colic or frequency.  ENDOCRINE: Denies any polyuria, nocturia or thyroid problems.  HEMATOLOGIC AND LYMPHATIC: Denies anemia, easy bruisability or bleeding.  SKIN: No  acne. No rash.  MUSCULOSKELETAL: Denies any pain in the neck, back or shoulder. Has a history of osteoarthritis. NEUROLOGIC: No numbness. History of CVA affecting left eye.  PSYCHIATRIC: Does have some anxiety, depression.    PHYSICAL EXAMINATION:  VITAL SIGNS: Temperature 98.8, pulse  117, respirations 24, blood pressure 202/114, O2 99%. GENERAL: The patient is critically ill-appearing, morbidly obese female, mildly tachypneic.  HEENT: Head atraumatic, normocephalic. Pupils equally round and reactive to light and accommodation. There is no conjunctival pallor. No scleral icterus. Nasal exam shows no drainage or ulceration. Oropharynx is clear, without any exudate.  NECK: Supple, with JVD.  CARDIOVASCULAR: Tachycardic. There are no murmurs or rubs.  LUNGS: Bilateral crackles throughout both lungs.  ABDOMEN: Soft, nontender, nondistended. Positive bowel sounds x4.  EXTREMITIES: No clubbing, cyanosis or edema.  SKIN: No rash.  LYMPHATICS: No lymph nodes palpable.  VASCULAR: Good DP, PT pulses.  PSYCHIATRIC: Not anxious or depressed.  NEUROLOGIC: Awake, alert, oriented x3. No focal deficits.   EVALUATION: EKG shows sinus tachycardia with a left bundle branch block. Laboratory  reveals a BNP of 8012. Glucose 257, BUN 41, creatinine 1.95, sodium 133, CO2 17, potassium of 5.1. LFTs showed AST of 96. Troponin 0.08. WBC 11.5, hemoglobin 9, platelet count is 286. Chest x-ray suggestive of bilateral airspace opacities, most consistent with pulmonary edema, small bilateral effusions.   ASSESSMENT AND PLAN: The patient is an 81 year old white female with history of diastolic congestive heart failure, chronic renal failure, hypertension, asthma, who presents with shortness of breath, noted to be in acute respiratory failure, accelerated hypertension.   1. Acute respiratory failure, likely due to acute diastolic congestive heart failure and acute asthma exacerbation. At this time, will treat her with IV Lasix b.i.d. Do Xopenex nebulizers and steroids, IV Solu-Medrol q.8 hours. We will also place her on empiric antibiotics with her cough with IV Levaquin.  2. Accelerated hypertension. She will be on a nitroglycerin drip. I will increase her Coreg dose and place her on p.r.n. hydralazine as  well.  3. Hyperlipidemia, not on any treatment. Will check a fasting lipid panel in the a.m.  4. Gastroesophageal reflux disease. Will substitute omeprazole for Protonix.  5. History of cerebrovascular accident. Continue Plavix.  6. Miscellaneous. She will be on heparin for deep vein thrombosis prophylaxis.   CODE STATUS: The patient is DNR. She has the form with her, and confirmed this with her daughter.   NOTE: Critical care time spent 55 minutes on this patient.   ____________________________ Lacie ScottsShreyang H. Allena KatzPatel, MD shp:lb D: 02/02/2014 12:09:17 ET T: 02/02/2014 13:34:51 ET JOB#: [id number]  cc: Pacen Watford H. Allena KatzPatel, MD, <Dictator> Charise CarwinSHREYANG H Kyri Dai MD ELECTRONICALLY SIGNED 02/02/2014 17:03

## 2014-12-11 NOTE — Consult Note (Signed)
CHIEF COMPLAINT and HISTORY:  Subjective/Chief Complaint SOB, worsening renal function, confusion   History of Present Illness 81 yo WF with CKD admitted 10 days ago with SOB, accelerated HTN, and confusion.  Has had no improvement in her renal function despite optimal medical therapy, and is now decided by primary service and nephrology that she needs to start dialysis today.  We are asked to place a temporary dialysis catheter.  She is confused and can provide no history.   PAST MEDICAL/SURGICAL HISTORY:  Past Medical History:   arthritis:    chicken pox:    Asthma:    anemia:    reactive airways disease:    hyperlipidemia:    GERD:    diverticulosis:    Diabetes (Diet Controlled):    Low Back Pain:    Depression:    hypertension:    right total knee replacement: 04-May-2010   Hysterectomy:    Back surgery:   ALLERGIES:  Allergies:  Phenergan: Alt Ment Status  Sulfa drugs: Rash  Meloxicam: Swelling  Prevacid: Unknown  Other- Explain in Comments Line: Unknown  HOME MEDICATIONS:  Home Medications: Medication Instructions Status  Proventil HFA CFC free 90 mcg/inh inhalation aerosol 2 puff(s) inhaled 4 times a day as needed for shortness of breath/ wheezing  Active  citalopram 40 mg oral tablet 1 tab(s) orally once a day Active  Premarin Vaginal 0.625 mg/g vaginal cream with applicator 1 application vaginal 3 times a week, As Needed Active  acetaminophen 650 mg oral tablet, extended release 1 tab(s) orally 3 times a day Active  carvedilol 6.25 mg oral tablet 1 tab(s) orally 2 times a day Active  Plavix 75 mg oral tablet 1 tab(s) orally once a day Active  Calcarb with D 600 mg-400 intl units oral tablet 1 tab(s) orally once a day Active  Voltaren Topical 1% topical gel Apply topically to affected area once a day Active  Lasix 20 mg oral tablet 1 tab(s) orally once a day Active  acetaminophen-HYDROcodone 325 mg-5 mg oral tablet 0.5-1 tab(s) orally every 6 hours,  As Needed Active  cholestyramine 1 dose(s) orally once a day Active  Protonix 40 mg oral delayed release tablet 1 tab(s) orally once a day Active  One-A-Day Women Multiple Vitamins with Minerals tablet 1 tab(s) orally once a day  Active   Family and Social History:  Family History Non-Contributory   Social History positive tobacco (Greater than 1 year), negative ETOH   Place of Living Home   Review of Systems:  ROS Pt not able to provide ROS   Medications/Allergies Reviewed Medications/Allergies reviewed   Physical Exam:  GEN well developed, well nourished   HEENT pink conjunctivae, moist oral mucosa   NECK No masses  trachea midline   RESP normal resp effort  no use of accessory muscles   CARD irregular rate  no JVD   VASCULAR ACCESS none   ABD denies tenderness  soft   GU foley catheter in place  clear yellow urine draining   LYMPH negative neck, negative axillae   EXTR negative cyanosis/clubbing, positive edema   SKIN normal to palpation, skin turgor good   NEURO cranial nerves intact, motor/sensory function intact   PSYCH confused, poor historian   LABS:  Laboratory Results: Hepatic:    16-Jun-15 10:46, Comprehensive Metabolic Panel  Bilirubin, Total 0.4  Alkaline Phosphatase 82  45-117  NOTE: New Reference Range  07/10/13  SGPT (ALT) 22  SGOT (AST) 96  Total Protein, Serum 7.4  Albumin,  Serum 3.5  LabUnknown:    16-Jun-15 10:46, Blood Culture  Ind. Clindamycin Resistance NEGATIVE  Ind. Clindamycin Resistance NEGATIVE  Routine Micro:  Organism Name   STAPHYLOCOCCUS CAPITIS  Clindamycin Sensitivity S  Oxacillin Sensitivity S  Ciprofloxacin Sensitivity R  Gentamicin Sensitivity S  Erythromycin Sensitivity R  Levofloxacin Sensitivity I  Micro Text Report   BLOOD CULTURE    ORGANISM 1                STAPHYLOCOCCUS CAPITIS    COMMENT                   IN ANAEROBE BOTTLE ONLY    COMMENT                   -    GRAM STAIN                GRAM  POSITIVE COCCI IN CLUSTERS     ANTIBIOTIC                    ORG#1     CIPROFLOXACIN                 R          CLINDAMYCIN                   S          ERYTHROMYCIN                  R          GENTAMICIN                    S          LEVOFLOXACIN                  I          OXACILLIN                     S          CEFOXITIN SCREEN              NEGATIVE   INDUCIBLE CLINDAMYCIN RESISTANNEGATIVE  Organism 1   STAPHYLOCOCCUS CAPITIS  Culture Comment   IN ANAEROBE BOTTLE ONLY  Culture Comment . -  Gram Stain 1   GRAM POSITIVE COCCI IN CLUSTERS  Organism Name   STAPHYLOCOCCUS CAPITIS  Clindamycin Sensitivity S  Oxacillin Sensitivity S  Ciprofloxacin Sensitivity R  Gentamicin Sensitivity S  Erythromycin Sensitivity R  Levofloxacin Sensitivity I  Micro Text Report   BLOOD CULTURE    ORGANISM 1                STAPHYLOCOCCUS CAPITIS    COMMENT                   IN AEROBIC AND ANAEROBIC BOTTLES    COMMENT                   -    GRAM STAIN                GRAM POSITIVE COCCI IN CLUSTERS    GRAM STAIN                -     ANTIBIOTIC                    ORG#1  ORG#2      CIPROFLOXACIN                 R                   CLINDAMYCIN                   S                   ERYTHROMYCIN                  R                   GENTAMICIN                    S   LEVOFLOXACIN                  I                   OXACILLIN                     S  Organism 1   STAPHYLOCOCCUS CAPITIS  Culture Comment   IN AEROBIC AND ANAEROBIC BOTTLES  Culture Comment . -  Gram Stain 1   GRAM POSITIVE COCCI IN CLUSTERS  Gram Stain 2 -    21-Jun-15 14:46, Blood Culture  Micro Text Report   BLOOD CULTURE    COMMENT                   NO GROWTH AEROBICALLY/ANAEROBICALLY IN 5 DAYS     ANTIBIOTIC  Culture Comment   NO GROWTH AEROBICALLY/ANAEROBICALLY IN 5 DAYS   Result(s) reported on 12 Feb 2014 at 03:00PM.    21-Jun-15 14:54, Blood Culture  Micro Text Report   BLOOD CULTURE    COMMENT                   NO  GROWTH AEROBICALLY/ANAEROBICALLY IN 5 DAYS     ANTIBIOTIC  Culture Comment   NO GROWTH AEROBICALLY/ANAEROBICALLY IN 5 DAYS   Result(s) reported on 12 Feb 2014 at 03:00PM.    24-Jun-15 13:39, Urine Culture  Micro Text Report   URINE CULTURE    COMMENT                   NO GROWTH IN 36 HOURS     ANTIBIOTIC  Specimen Source   INDWELLING CATHETER  Culture Comment   NO GROWTH IN 36 HOURS   Result(s) reported on 12 Feb 2014 at 08:19AM.  Lab:    16-Jun-15 10:45, ABG and Lactic Acid  pH (ABG) 7.14  PCO2 48  PO2 84  FiO2 60  Base Excess -12.6  HCO3 16.3  O2 Saturation 94.9  O2 Device v60  Specimen Site (ABG)   RT RADIAL  Specimen Type (ABG) ARTERIAL  Patient Temp (ABG) 37.0  PSV 18  PEEP 6.0  Mechanical Rate 12    16-Jun-15 12:40, ABG  pH (ABG) 7.27  PCO2 35  PO2 149  FiO2 60  Base Excess -9.9  HCO3 16.1  O2 Saturation 97.9  O2 Device v60  Specimen Site (ABG)   RT RADIAL  Specimen Type (ABG) ARTERIAL  Patient Temp (ABG) 37.0  PSV 16  PEEP 6.0  Mechanical Rate 12  Result(s) reported on 02 Feb 2014 at 12:52PM.  Cardiology:    16-Jun-15 10:29, ED ECG  Ventricular Rate  120  Atrial Rate 120  P-R Interval 144  QRS Duration 142  QT 348  QTc 491  R Axis 11  T Axis 164  ECG interpretation   Sinus tachycardia  Left bundle branch block  Abnormal ECG  When compared with ECG of 31-Dec-2013 15:35,  Vent. rate has increased BY  54 BPM  T wave inversion more evident in Lateral leads  ----------unconfirmed----------  Confirmed by OVERREAD, NOT ([id number]), editor PEARSON, BARBARA ([id number]) on 02/03/2014 1:18:38 PM  ED ECG   Routine Chem:    16-Jun-15 10:45, ABG and Lactic Acid  Result Comment   - READ-BACK PROCESS PERFORMED.   - Dr. Jasmine December 02/02/14 at 1056   Result(s) reported on 02 Feb 2014 at 10:59AM.    16-Jun-15 10:46, Blood Culture  Result Comment   ANAEROBIC BOTTLE - CRITICAL VALUE PREVIOUSLY NOTIFIED.  STAPH CAPITIS - WARNING: For oxacillin-resistant S.aureus   -  and coagulase-negative staphylococci(MRS),   - other beta-lactam agents, ie, penicillins,   - beta-lactam/beta-lactamase inhibitor  - combinations, cephems (with the exception   - of the cephalosporins with anti-MRSA   - activity), and carbapenems, may appear   - active in vitro, but are not effective   - clinically.   - --CLSI, Vol.32 No.3, January 2012, pg 70.   Result(s) reported on 07 Feb 2014 at 07:57AM.  Result Comment   ANAEROBIC BOTTLE - NOTIFIED OF CRITICAL VALUE    - DV TO BRITNEY KILLINGSWORTH @0841    - 02/03/14   - READ-BACK PROCESS PERFORMED.  AEROBIC BOTTLE - CRITICAL VALUE PREVIOUSLY NOTIFIED.  STAPH CAPITIS - WARNING: For oxacillin-resistant S.aureus   - andcoagulase-negative staphylococci(MRS),   - other beta-lactam agents, ie, penicillins,   - beta-lactam/beta-lactamase inhibitor   - combinations, cephems (with the exception   - of the cephalosporins with anti-MRSA   - activity), and carbapenems, may appear   - active in vitro, but are not effective   - clinically.   - --CLSI, Vol.32 No.3, January 2012, pg 70.   Result(s) reported on 06 Feb 2014 at 04:05PM.    16-Jun-15 10:46, B-Type Natriuretic Peptide [facility])  B-Type Natriuretic Peptide [facility]) 8012  Result(s) reported on 02 Feb 2014 at 11:47AM.    16-Jun-15 10:46, Cardiac Panel  Result Comment   TOTAL CK - Slight hemolysis, interpret results with   - caution.   Result(s) reported on 02 Feb 2014 at 01:56PM.    16-Jun-15 10:46, Comprehensive Metabolic Panel  Glucose, Serum 257  BUN 41  Creatinine (comp) 1.95  Sodium, Serum 133  Potassium, Serum 5.1  Chloride, Serum 105  CO2, Serum 17  Calcium (Total), Serum 8.5  Osmolality (calc) 285  eGFR (African American) 27  eGFR (Non-African American) 23  eGFR values <69m/min/1.73 m2 may be an indication of chronic  kidney disease (CKD).  Calculated eGFR is useful in patients with stable renal function.  The eGFR calculation will not be reliable in acutely ill  patients  when serum creatinine is changing rapidly. It is not useful in   patients on dialysis. The eGFR calculation may not be applicable  to patients at the low and high extremes of body sizes, pregnant  women, and vegetarians.  Result Comment   POTASSIUM,AST - Slight hemolysis, interpret results with   - caution.   Result(s) reported on 02 Feb 2014 at 11:47AM.  Anion Gap 11    16-Jun-15 10:46, Troponin I  Result Comment   TROPONIN - RESULTS VERIFIED BY REPEAT TESTING.   - C/JACKIE [id number]   -  READ-BACK PROCESS PERFORMED.   Result(s) reported on 02 Feb 2014 at 12:02PM.    16-Jun-15 14:45, Troponin I  Result Comment   troponin - RESULTS VERIFIED BY REPEAT TESTING.   - CALLED TO VICKI SCOTT 02/02/14 AT   - 5093 BY JEM.   - READ-BACK PROCESS PERFORMED.   Result(s) reported on 02 Feb 2014 at 03:50PM.    16-Jun-15 19:15, Troponin I  Result Comment   TROPONIN - RESULTS VERIFIED BY REPEAT TESTING.   - PREVIOUSLY CALLED TO VICKI SCOTT   - 02/02/14 AT 1551 BY JEM   Result(s) reported on 02 Feb 2014 at 08:10PM.    17-Jun-15 26:71, Basic Metabolic Panel (w/Total Calcium)  Glucose, Serum 138  BUN 50  Creatinine (comp) 2.41  Sodium, Serum 135  Potassium, Serum 4.5  Chloride, Serum 106  CO2, Serum 17  Calcium (Total), Serum 8.4  Anion Gap 12  Osmolality (calc) 286  eGFR (African American) 21  eGFR (Non-African American) 18  eGFR values <32m/min/1.73 m2 may be an indication of chronic  kidney disease (CKD).  Calculated eGFR is useful in patients with stable renal function.  The eGFR calculation will not be reliable in acutely ill patients  when serum creatinine is changing rapidly. It is not useful in   patients on dialysis. The eGFR calculation may not be applicable  to patients at the low and high extremes of body sizes, pregnant  women, and vegetarians.    17-Jun-15 04:25, Lipid Profile ([facility]  Cholesterol, Serum 149  Triglycerides, Serum 147  HDL  (INHOUSE) 47  VLDL Cholesterol Calculated 29  LDL Cholesterol Calculated 73  Result(s) reported on 03 Feb 2014 at 04:53AM.    17-Jun-15 19:52, Ferritin ([facility]  Ferritin ([facility] 259  Result(s) reported on 03 Feb 2014 at 08:21PM.    17-Jun-15 19:52, Iron and IBC ([facility]  Iron Binding Capacity (TIBC) 339  Unbound Iron Binding Capacity 260  Iron, Serum 79  Iron Saturation 23  Result(s) reported on 03 Feb 2014 at 08:35PM.    18-Jun-15 024:58 Basic Metabolic Panel (w/Total Calcium)  Glucose, Serum 105  BUN 66  Creatinine (comp) 2.69  Sodium, Serum 132  Potassium, Serum 4.3  Chloride, Serum 103  CO2, Serum 18  Calcium (Total), Serum 8.9  Anion Gap 11  Osmolality (calc) 284  eGFR (African American) 18  eGFR (Non-African American) 16  eGFR values <67mmin/1.73 m2 may be an indication of chronic  kidney disease (CKD).  Calculated eGFR is useful in patients with stable renal function.  The eGFR calculation will not be reliable in acutely ill patients  when serum creatinine is changing rapidly. It is not useful in   patients on dialysis. The eGFR calculation may not be applicable  to patients at the low and high extremes of body sizes, pregnant  women, and vegetarians.    19-Jun-15 0309:98Basic Metabolic Panel (w/Total Calcium)  Glucose, Serum 99  BUN 77  Creatinine (comp) 2.86  Sodium, Serum 133  Potassium, Serum 4.0  Chloride, Serum 104  CO2, Serum 19  Calcium (Total), Serum 8.8  Anion Gap 10  Osmolality (calc) 289  eGFR (African American) 17  eGFR (Non-African American) 15  eGFR values <6011min/1.73 m2 may be an indication of chronic  kidney disease (CKD).  Calculated eGFR is useful in patients with stable renal function.  The eGFR calculation will not be reliable in acutely ill patients  when serum creatinine is changing rapidly. It is not useful in   patients on dialysis. The  eGFR calculation may not be applicable  to patients at the low and high extremes of body sizes,  pregnant  women, and vegetarians.    20-Jun-15 16:01, Basic Metabolic Panel (w/Total Calcium)  Glucose, Serum 121  BUN 70  Creatinine (comp) 2.85  Sodium, Serum 133  Potassium, Serum 3.8  Chloride, Serum 103  CO2, Serum 20  Calcium (Total), Serum 8.6  Anion Gap 10  Osmolality (calc) 288  eGFR (African American) 17  eGFR (Non-African American) 15  eGFR values <46m/min/1.73 m2 may be an indication of chronic  kidney disease (CKD).  Calculated eGFR is useful in patients with stable renal function.  The eGFR calculation will not be reliable in acutely ill patients  when serum creatinine is changing rapidly. It is not useful in   patients on dialysis. The eGFR calculation may not be applicable  to patients at the low and high extremes of body sizes, pregnant  women, and vegetarians.    21-Jun-15 009:32 Basic Metabolic Panel (w/Total Calcium)  Glucose, Serum 127  BUN 75  Creatinine (comp) 2.85  Sodium, Serum 134  Potassium, Serum 3.8  Chloride, Serum 104  CO2, Serum 19  Calcium (Total), Serum 8.6  Anion Gap 11  Osmolality (calc) 292  eGFR (African American) 17  eGFR (Non-African American) 15  eGFR values <63mmin/1.73 m2 may be an indication of chronic  kidney disease (CKD).  Calculated eGFR is useful in patients with stable renal function.  The eGFR calculation will not be reliable in acutely ill patients  when serum creatinine is changing rapidly. It is not useful in   patients on dialysis. The eGFR calculation may not be applicable  to patients at the low and high extremes of body sizes, pregnant  women, and vegetarians.    22-Jun-15 0535:57Basic Metabolic Panel (w/Total Calcium)  Glucose, Serum 114  BUN 78  Creatinine (comp) 3.09  Sodium, Serum 132  Potassium, Serum 3.7  Chloride, Serum 103  CO2, Serum 17  Calcium (Total), Serum 8.7  Anion Gap 12  Osmolality (calc) 289  eGFR (African American) 16  eGFR (Non-African American) 13  eGFR values <6068min/1.73 m2  may be an indication of chronic  kidney disease (CKD).  Calculated eGFR is useful in patients with stable renal function.  The eGFR calculation will not be reliable in acutely ill patients  when serum creatinine is changing rapidly. It is not useful in   patients on dialysis. The eGFR calculation may not be applicable  to patients at the low and high extremes of body sizes, pregnant  women, and vegetarians.    23-Jun-15 04:32:20asic Metabolic Panel (w/Total Calcium)  Glucose, Serum 107  BUN 93  Creatinine (comp) 3.10  Sodium, Serum 128  Potassium, Serum 3.5  Chloride, Serum 99  CO2, Serum 16  Calcium (Total), Serum 8.8  Anion Gap 13  Osmolality (calc) 286  eGFR (African American) 15  eGFR (Non-African American) 13  eGFR values <66m70mn/1.73 m2 may be an indication of chronic  kidney disease (CKD).  Calculated eGFR is useful in patients with stable renal function.  The eGFR calculation will not be reliable in acutely ill patients  when serum creatinine is changing rapidly. It is not useful in   patients on dialysis. The eGFR calculation may not be applicable  to patients at the low and high extremes of body sizes, pregnant  women, and vegetarians.    24-Jun-15 04:425:42sic Metabolic Panel (w/Total Calcium)  Glucose, Serum 107  BUN 83  Creatinine (  comp) 3.00  Sodium, Serum 127  Potassium, Serum 3.8  Chloride, Serum 96  CO2, Serum 17  Calcium (Total), Serum 8.9  Anion Gap 14  Osmolality (calc) 281  eGFR (African American) 16  eGFR (Non-African American) 14  eGFR values <68m/min/1.73 m2 may be an indication of chronic  kidney disease (CKD).  Calculated eGFR is useful in patients with stable renal function.  The eGFR calculation will not be reliable in acutely ill patients  when serum creatinine is changing rapidly. It is not useful in   patients on dialysis. The eGFR calculation may not be applicable  to patients at the low and high extremes of body sizes,  pregnant  women, and vegetarians.    25-Jun-15 074:12 Basic Metabolic Panel (w/Total Calcium)  Glucose, Serum 98  BUN 85  Creatinine (comp) 2.85  Sodium, Serum 125  Potassium, Serum 3.8  Chloride, Serum 96  CO2, Serum 17  Calcium (Total), Serum 9.3  Anion Gap 12  Osmolality (calc) 277  eGFR (African American) 17  eGFR (Non-African American) 15  eGFR values <672mmin/1.73 m2 may be an indication of chronic  kidney disease (CKD).  Calculated eGFR is useful in patients with stable renal function.  The eGFR calculation will not be reliable in acutely ill patients  when serum creatinine is changing rapidly. It is not useful in   patients on dialysis. The eGFR calculation may not be applicable  to patients at the low and high extremes of body sizes, pregnant  women, and vegetarians.    26-Jun-15 0487:86Basic Metabolic Panel (w/Total Calcium)  Glucose, Serum 91  BUN 86  Creatinine (comp) 2.90  Sodium, Serum 126  Potassium, Serum 3.9  Chloride, Serum 95  CO2, Serum 18  Calcium (Total), Serum 8.7  Anion Gap 13  Osmolality (calc) 279  eGFR (African American) 17  eGFR (Non-African American) 14  eGFR values <6071min/1.73 m2 may be an indication of chronic  kidney disease (CKD).  Calculated eGFR is useful in patients with stable renal function.  The eGFR calculation will not be reliable in acutely ill patients  when serum creatinine is changing rapidly. It is not useful in   patients on dialysis. The eGFR calculation may not be applicable  to patients at the low and high extremes of body sizes, pregnant  women, and vegetarians.  Cardiac:    16-Jun-15 10:46, Cardiac Panel  CK, Total 178  26-192  NOTE: NEW REFERENCE RANGE   09/21/2013  CPK-MB, Serum 2.9    16-Jun-15 10:46, Troponin I  Troponin I 0.08  0.00-0.05  0.05 ng/mL or less: NEGATIVE   Repeat testing in 3-6 hrs   if clinically indicated.  >0.05 ng/mL: POTENTIAL   MYOCARDIAL INJURY. Repeat   testing in 3-6 hrs if    clinically indicated.  NOTE: An increase or decrease   of 30% or more on serial   testing suggests a   clinically important change    16-Jun-15 14:45, Cardiac Panel  CK, Total 81  26-192  NOTE: NEW REFERENCE RANGE   09/21/2013  CPK-MB, Serum 5.3  Result(s) reported on 02 Feb 2014 at 03:27PM.    16-Jun-15 14:45, Troponin I  Troponin I 1.40  0.00-0.05  0.05 ng/mL or less: NEGATIVE   Repeat testing in 3-6 hrs   if clinically indicated.  >0.05 ng/mL: POTENTIAL   MYOCARDIAL INJURY. Repeat   testing in 3-6 hrs if   clinically indicated.  NOTE: An increase or decrease   of 30% or more on  serial   testing suggests a   clinically important change    16-Jun-15 16:25, CPK-MB, Serum  CPK-MB, Serum 5.8  Result(s) reported on 02 Feb 2014 at 05:02PM.    16-Jun-15 18:34, CPK-MB, Serum  CPK-MB, Serum 6.0  Result(s) reported on 02 Feb 2014 at 07:17PM.    16-Jun-15 19:15, Cardiac Panel  CK, Total 80  26-192  NOTE: NEW REFERENCE RANGE   09/21/2013  CPK-MB, Serum 5.8  Result(s) reported on 02 Feb 2014 at 08:00PM.    16-Jun-15 19:15, Troponin I  Troponin I 1.20  0.00-0.05  0.05 ng/mL or less: NEGATIVE   Repeat testing in 3-6 hrs   if clinically indicated.  >0.05 ng/mL: POTENTIAL   MYOCARDIAL INJURY. Repeat   testing in 3-6 hrs if   clinically indicated.  NOTE: An increase or decrease   of 30% or more on serial   testing suggests a   clinically important change    16-Jun-15 22:29, CPK-MB, Serum  CPK-MB, Serum 5.2  Result(s) reported on 02 Feb 2014 at 11:12PM.  Routine UA:    16-Jun-15 11:21, Urinalysis  Color (UA) Yellow  Clarity (UA) Clear  Glucose (UA) Negative  Bilirubin (UA) Negative  Ketones (UA) Negative  Specific Gravity (UA) 1.010  Blood (UA) 1+  pH (UA) 5.0  Protein (UA)   100 mg/dL  Nitrite (UA) Negative  Leukocyte Esterase (UA) Negative  Result(s) reported on 02 Feb 2014 at 12:25PM.  RBC (UA) 2 /HPF  WBC (UA) 5 /HPF  Bacteria (UA) 3+  Epithelial Cells  (UA)   NONE SEEN  Mucous (UA) PRESENT  Amorphous Crystal (UA) PRESENT  Result(s) reported on 02 Feb 2014 at 12:25PM.    23-Jun-15 23:06, Urinalysis  Color (UA) Yellow  Clarity (UA) Hazy  Glucose (UA) Negative  Bilirubin (UA) Negative  Ketones (UA) Negative  Specific Gravity (UA) 1.011  Blood (UA) Negative  pH (UA) 5.0  Protein (UA) 30 mg/dL  Nitrite (UA) Negative  Leukocyte Esterase (UA) 2+  Result(s) reported on 09 Feb 2014 at 11:46PM.  RBC (UA) 14 /HPF  WBC (UA) 28 /HPF  Bacteria (UA) 3+  Epithelial Cells (UA) <1 /HPF  Mucous (UA) PRESENT  Hyaline Cast (UA) 2 /LPF  Result(s) reported on 09 Feb 2014 at 11:46PM.  Routine Hem:    16-Jun-15 10:46, Hemogram, Platelet Count  WBC (CBC) 11.5  RBC (CBC) 3.18  Hemoglobin (CBC) 9.0  Hematocrit (CBC) 28.6  Platelet Count (CBC) 286  Result(s) reported on 02 Feb 2014 at 11:30AM.  MCV 90  MCH 28.2  MCHC 31.3  RDW 13.9    17-Jun-15 04:25, CBC Profile  WBC (CBC) 7.8  RBC (CBC) 2.42  Hemoglobin (CBC) 7.1  Hematocrit (CBC) 21.4  Platelet Count (CBC) 195  MCV 88  MCH 29.2  MCHC 33.0  RDW 13.6  Neutrophil % 82.6  Lymphocyte % 14.1  Monocyte % 2.6  Eosinophil % 0.1  Basophil % 0.6  Neutrophil # 6.4  Lymphocyte # 1.1  Monocyte # 0.2  Eosinophil # 0.0  Basophil # 0.0  Result(s) reported on 03 Feb 2014 at 04:48AM.    17-Jun-15 14:34, Hemoglobin  Hemoglobin (CBC) 6.8  Result(s) reported on 03 Feb 2014 at 02:53PM.    18-Jun-15 04:00, CBC Profile  WBC (CBC) 11.3  RBC (CBC) 2.49  Hemoglobin (CBC) 7.2  Hematocrit (CBC) 22.1  Platelet Count (CBC) 198  MCV 89  MCH 28.8  MCHC 32.5  RDW 13.5  Neutrophil % 70.4  Lymphocyte % 14.8  Monocyte %  13.9  Eosinophil % 0.6  Basophil % 0.3  Neutrophil # 8.0  Lymphocyte # 1.7  Monocyte # 1.6  Eosinophil # 0.1  Basophil # 0.0  Result(s) reported on 04 Feb 2014 at 04:21AM.    19-Jun-15 03:45, CBC Profile  WBC (CBC) 11.5  RBC (CBC) 2.60  Hemoglobin (CBC) 7.4  Hematocrit (CBC)  22.8  Platelet Count (CBC) 208  MCV 88  MCH 28.5  MCHC 32.5  RDW 13.9  Neutrophil % 59.4  Lymphocyte % 20.6  Monocyte % 14.7  Eosinophil % 4.5  Basophil % 0.8  Neutrophil # 6.8  Lymphocyte # 2.4  Monocyte # 1.7  Eosinophil # 0.5  Basophil # 0.1  Result(s) reported on 05 Feb 2014 at 04:31AM.    25-Jun-15 05:17, Hemoglobin  Hemoglobin (CBC) 7.3  Result(s) reported on 11 Feb 2014 at 06:06AM.   RADIOLOGY:  Radiology Results: XRay:    01-Nov-14 08:49, Chest Portable Single View  Chest Portable Single View  REASON FOR EXAM:    Shortness of Breath  COMMENTS:       PROCEDURE: DXR - DXR PORTABLE CHEST SINGLE VIEW  - Jun 20 2013  8:49AM     RESULT: Comparison is made to the study of 06/28/2010.    The cardiac silhouette is enlarged. There is diffuse interstitial edema.   Density is more prominent in the mid left lung laterally and in the right   lung base. Bilateral pneumonia is not excluded. There is no significant   effusion. Atherosclerotic calcification is present.    IMPRESSION:   1. Cardiomegaly with asymmetric pulmonary edema as described. Bilateral   infiltrates or not excluded. Followup is recommended.  Dictation Site: 6        Verified By: Sundra Aland, M.D., MD    [phone] 09:54, Chest PA and Lateral  Chest PA and Lateral  REASON FOR EXAM:    hypoxia. CHF f/u  COMMENTS:       PROCEDURE: DXR - DXR CHEST PA (OR AP) AND LATERAL  - Jun 21 2013  9:54AM     RESULT: Comparison is made to the study of 06/20/2013.    Small bilateral effusions are present. There is evidence of mild   interstitial edema with some peribronchial cuffing. Minimal right lung   base atelectasis appears present. There is no pneumothorax.    IMPRESSION:  Small bilateral pleural effusions with changes suggestive of   mild interstitial edema.    Dictation Site: 1    Verified By: Sundra Aland, M.D., MD    14-May-15 17:37, Lumbar Spine AP and Lateral  Lumbar Spine AP and  Lateral  REASON FOR EXAM:    fall, low back pain  COMMENTS:       PROCEDURE: DXR - DXR LUMBAR SPINE AP AND LATERAL  - Dec 31 2013  5:37PM     CLINICAL DATA:  History of trauma from a fall complaining of low  back pain.    EXAM:  LUMBAR SPINE - 2-3 VIEW    COMPARISON:  MRI of the lumbar spine 07/20/2004.    FINDINGS:  Dextroscoliosis of the lumbar spine convex to the right at the level  of L3. Severe multilevel degenerative disc disease, most pronounced  at L1-L2, L2-L3 and L3-L4. Severe multilevel facet arthropathy.  Exaggerated lordosis of the lumbar spine. 6 mm of retrolisthesis of  L4 upon L5. 6 mm of retrolisthesis of L3 upon L4. No definite acute  displaced fracture of the lumbar spine  noted on today's plain film  examination. Old compression fracture of L1 with approximately 20%  loss of anterior vertebral body height, unchanged.     IMPRESSION:  1. Dextroscoliosis, multilevel degenerative disc disease and severe  lumbar spondylosis redemonstrated, as above, without definite acute  findings.      Electronically Signed    By: Vinnie Langton M.D.    On: 12/31/2013 17:50         Verified By: Etheleen Mayhew, M.D.,    16-Jun-15 10:32, Chest Portable Single View  Chest Portable Single View  REASON FOR EXAM:    Shortness of Breath  COMMENTS:       PROCEDURE: DXR - DXR PORTABLE CHEST SINGLE VIEW  - Feb 02 2014 10:32AM     CLINICAL DATA:  Shortness of breath.    EXAM:  PORTABLE CHEST - 1 VIEW    COMPARISON:  06/21/2013    FINDINGS:  Cardiac silhouette appears slightly enlarged. Thoracic aortic  calcifications noted. There is pulmonary vascular congestion with  bilateral airspace opacities, greatest in the mid and lower lungs.  There are small bilateral pleural effusions. No pneumothorax is  identified. Degenerative changes are noted at both glenohumeral  articulations.     IMPRESSION:  Bilateral airspace opacities most consistent with pulmonary  edema.  Small bilateral pleural effusions.      Electronically Signed    By: Logan Bores    On: 02/02/2014 10:57       Verified By: Ferol Luz, M.D.,    18-Jun-15 10:15, Chest Portable Single View  Chest Portable Single View  REASON FOR EXAM:    Follow up CHF  COMMENTS:       PROCEDURE: DXR - DXR PORTABLE CHEST SINGLE VIEW  - Feb 04 2014 10:15AM     CLINICAL DATA:  Recent congestive heart failure ; anemia    EXAM:  PORTABLE CHEST - 1 VIEW    COMPARISON:  February 02, 2014    FINDINGS:  There has been virtually complete clearing of edema compared to  recent prior study. Trace interstitial edema remains. Elsewhere  lungs are clear. Heart is mildly enlarged. The pulmonary vascularity  is within normal limits. No adenopathy.     IMPRESSION:  There is only trace residual interstitial edema. There has been  significant interval clearing since recent prior study. No new  opacity. Heart is mildly enlarged with normal pulmonary vascularity.      Electronically Signed    By: Lowella Grip M.D.    On: 02/04/2014 10:19         Verified By: Leafy Kindle. Jasmine December, M.D.,    22-Jun-15 06:27, Chest Portable Single View  Chest Portable Single View  REASON FOR EXAM:    chf  COMMENTS:       PROCEDURE: DXR - DXR PORTABLE CHEST SINGLE VIEW  - Feb 08 2014  6:27AM     CLINICAL DATA:  CHF    EXAM:  PORTABLE CHEST - 1 VIEW    COMPARISON:  02/04/2009    FINDINGS:  Cardiac shadow is stable. The lungs arewell aerated bilaterally. No  focal infiltrate or sizable effusion is seen. Degenerative changes  of left shoulder joint are noted.     IMPRESSION:  No acute infiltrate is identified. No significant vascular  congestion is noted.      Electronically Signed    By: Inez Catalina M.D.    On: 02/08/2014 08:02         Verified By:  Everlene Farrier, M.D.,    22-Jun-15 13:43, Lumbar Spine AP and Lateral  Lumbar Spine AP and Lateral  REASON FOR EXAM:    pain  COMMENTS:        PROCEDURE: DXR - DXR LUMBAR SPINE AP AND LATERAL  - Feb 08 2014  1:43PM     CLINICAL DATA:  Disc removed in lower back    EXAM:  LUMBAR SPINE - 2-3 VIEW    COMPARISON:  12/31/2013    FINDINGS:  There is levoscoliosis lumbar spine. There is significant joint  space narrowing at T12-L1, L1-L2, L2-L3 with significant  osteophytosis. There is no significant subluxation. No change from  prior.     IMPRESSION:  1. No acute findings lumbar spine.  2. Severe discosteophytic disease with joint space narrowing.      Electronically Signed    By: Suzy Bouchard M.D.    On: 02/08/2014 14:09         Verified By: Rennis Golden, M.D.,  [place]:    03-Nov-14 11:04, US Kidney Bilateral  US Kidney Bilateral  REASON FOR EXAM:    acute renal failure  COMMENTS:       PROCEDURE: [place]  - US KIDNEY  - Jun 22 2013 11:04AM     RESULT: Renal ultrasound dated 07/19/2013    Findings: The right kidney measures 10.7 x 4.15 x 5.88 cm and the left   11.34 x 4.99 x 6.59 cm. There is appropriate cortical medullary   differentiation without evidence of hydronephrosis, solid or cystic   masses, nor calculi. Urinary bladder is decompressed. Patient status post   Foley catheter insertion.    IMPRESSION:  Unremarkable renalultrasound.    Verified By: Mikki Santee, M.D., MD    06-Jan-15 14:40, US Carotid Doppler Bilateral  US Carotid Doppler Bilateral  REASON FOR EXAM:    CALL REPORT DR. APPENZELLER  [phone] ALPFXT   retinal artery occlusion Ne...  COMMENTS:       PROCEDURE: KUS - KUS CAROTID DOPPLER BILATERAL  - Aug 25 2013  2:40PM     CLINICAL DATA:  Retinal artery occlusion.    EXAM:  BILATERAL CAROTID DUPLEX ULTRASOUND    TECHNIQUE:  Pearline Cables scale imaging, color Doppler and duplex ultrasound were  performed of bilateral carotid and vertebral arteries in the neck.  COMPARISON:  None.    FINDINGS:  Criteria: Quantification of carotid stenosis is based on velocity  parameters that  correlate the residual internal carotid diameter  with NASCET-based stenosis levels, using the diameter of the distal  internal carotid lumen as the denominator for stenosis measurement.    The following velocity measurements were obtained:    RIGHT    ICA:  128/28 cm/sec    CCA:  024/09 cm/sec  SYSTOLIC ICA/CCA RATIO:  7.35    DIASTOLIC ICA/CCA RATIO:  3.29    ECA:  129 cm/sec    LEFT    ICA:  167/52 cm/sec    CCA:  92/42 cm/sec    SYSTOLIC ICA/CCA ASTMH:9.62    DIASTOLIC ICA/CCA RATIO:  2.29  ECA:  101 cm/sec    RIGHT CAROTID ARTERY: Moderate irregular and partially calcified  plaque formation is noted in the proximal right internal carotid  artery consistent with 50-69% stenosis based on ultrasoundand peak  systolic velocity criteria.    RIGHT VERTEBRAL ARTERY:  Antegrade flow is noted.    LEFT CAROTID ARTERY: Moderate irregular and partially calcified  plaque is noted in  the proximal left internal carotid artery  consistent with 50-69% stenosis based on ultrasound and Doppler  criteria.    LEFT VERTEBRAL ARTERY:  Antegrade flow is noted.   IMPRESSION:  Moderate irregular and partially calcified plaque is noted in both  proximal internal carotid arteries consistent with 50-69% stenosis  based on ultrasound and Doppler criteria.      Electronically Signed    By: Sabino Dick M.D.    On: 08/25/2013 14:46         Verified By: Marveen Reeks, M.D.,  [place]:    01-Nov-14 08:49, Chest Portable Single View  PACS Image    [phone] 09:54, Chest PA and Lateral  PACS Image    03-Nov-14 11:04, US Kidney Bilateral  PACS Image    06-Jan-15 14:40, US Carotid Doppler Bilateral  PACS Image    14-May-15 16:10, CT Head Without Contrast  PACS Image    14-May-15 17:37, Lumbar Spine AP and Lateral  PACS Image    16-Jun-15 10:32, Chest Portable Single View  PACS Image    16-Jun-15 10:46, Blood Culture  Ind. Clindamycin Resistance  Ind. Clindamycin Resistance     18-Jun-15 10:15, Chest Portable Single View  PACS Image    22-Jun-15 06:27, Chest Portable Single View  PACS Image    22-Jun-15 13:43, Lumbar Spine AP and Lateral  PACS Image  CT:    14-May-15 16:10, CT Head Without Contrast  CT Head Without Contrast  REASON FOR EXAM:    head injury  COMMENTS:       PROCEDURE: CT  - CT HEAD WITHOUT CONTRAST  - Dec 31 2013  4:10PM     CLINICAL DATA:  Head injury after fall.    EXAM:  CT HEAD WITHOUT CONTRAST    TECHNIQUE:  Contiguous axial images were obtained fromthe base of the skull  through the vertex without intravenous contrast.    COMPARISON:  CT scan of June 28, 2010.  FINDINGS:  Bony calvarium appears intact. Mild right occipital scalp hematoma  is noted. Diffuse cortical atrophy is noted. Mild chronic ischemic  white matter disease is noted. No mass effect or midline shift is  noted. Ventricular size is within normal limits. There is no  evidence of mass lesion, hemorrhage or acute infarction.     IMPRESSION:  Mild right occipital scalp hematoma. Diffuse cortical atrophy and  chronic ischemic white matter disease is again noted. No acute  intracranial abnormality seen.      Electronically Signed    By: Sabino Dick M.D.    On: 12/31/2013 16:14         Verified By: Marveen Reeks, M.D.,   ASSESSMENT AND PLAN:  Assessment/Admission Diagnosis worsening renal failure, symptomatic multiple other medical issues   Plan temporary dialysis catheter placed at bedside without difficult   level 3   Electronic Signatures: Algernon Huxley (MD)  (Signed 26-Jun-15 15:45)  Authored: Chief Complaint and History, PAST MEDICAL/SURGICAL HISTORY, ALLERGIES, HOME MEDICATIONS, Family and Social History, Review of Systems, Physical Exam, LABS, RADIOLOGY, Assessment and Plan   Last Updated: 26-Jun-15 15:45 by Algernon Huxley (MD)

## 2014-12-11 NOTE — Consult Note (Signed)
PATIENT NAME:  Bailey Ramirez, Bailey Ramirez MR#:  [id number] DATE OF BIRTH:  12-Sep-1928  DATE OF CONSULTATION:  02/14/2014  REFERRING PHYSICIAN:   CONSULTING PHYSICIAN:  Pauletta BrownsYuriy Zeylikman, MD  REASON FOR CONSULTATION: Altered mental status.   HISTORY OF PRESENT ILLNESS: An 81 year old female with past medical history of hypertension, hyperlipidemia, osteoarthritis, lumbar stenosis, presenting on 02/02/2014 with 1-week history of shortness breath. The patient has history of congestive heart failure, acute on chronic systolic and/or diastolic heart failure. Complicated course throughout the hospital stay with worsening renal function, status post dialysis x 2.  Patient's mental status deteriorated specifically during the last week, becoming more aggressive consistent with hyperactive delirium. Neurology  consulted for altered mental status. As the patient's family members were at bedside, the patient has stopped recognizing them, appears confused. States that she is currently at her sister's house. Has flight of ideas. Has decreased sleep throughout the night and increasingly throughout the day,   PAST MEDICAL HISTORY: Significant for hypertension, diabetes, asthma, pernicious anemia, diastolic systolic congestive heart failure, lumbar stenosis, hyperlipidemia, anxiety, depression, degenerative disk disease, degenerative arthritis, questionable history of a stroke in the past affecting the left eye which she follows as an outpatient.   HOME MEDICATIONS: Include Tylenol, calcium supplementation, carvedilol, cholestyramine,  citalopram, Lasix, Plavix, Premarin, Protonix, Proventil, Voltaren.  REVIEW OF SYSTEMS:  Unable to obtain at this time, due to the patient's medical condition. The patient's laboratory workup has been reviewed. Imaging has been reviewed.   PHYSICAL EXAMINATION:  VITAL SIGNS: The patient's temperature is 97.6, pulse 68, respirations 20, blood pressure 135/65. The patient's mental status alert to her  name, unaware of her surroundings. Could not tell me the date or time. Could not tell me the reason why she is in the hospital. Thinks she is at her sister's house. States her sister is 79 years old. Has flight of ideas. Speech appears to be fluent. No signs of dysarthria or aphasia.  CRANIAL NERVE EXAMINATION: Extraocular movements are intact. Pupils  4 to 2 reactive bilaterally. Facial sensation appears to be intact. No focal cranial nerve abnormalities found. Motor strength appears to be symmetrical, 4+/5 bilaterally, upper and lower extremities. Reflexes severely diminished in the setting of chronic diabetes. Sensation appears to be intact. This patient withdraws from painful stimuli in bilateral upper and lower extremities. Coordination could not be assessed and gait could not be assessed.   IMPRESSION: An 81 year old female with past medical history of hypertension, hyperlipidemia, osteoarthritis, lumbar stenosis, questionable stroke in the past, presenting with 1 week history of shortness of breath, acute on chronic congestive heart failure with worsening renal function status post HD x 2. Mental status has been deteriorating, consistent with delirium. As per patient's family members at bedside, patient has difficulty differentiating between different family members, could not remember their names. There are no focal neurological changes.   PLAN: Patient is obtaining CAT scan of the head today. I think this is acute delirium due to metabolic encephalopathy due to uremia that is improving and in the setting of prolonged hospital stay in an elderly patient. The patient has reverse of sleep-wake cycle. She is currently on Risperdal, which I agree on continuing. We will add melatonin nightly to promote sleep. Discussed with the family members to keep the blinds up during the day, to keep the TV on and to keep her stimulated, and to try to get the patient to sleep at night. Continue the current medical  management. I think that delirium or hyperactive delirium  will improve over time.   Thank you. It was a pleasure seeing this patient.    ____________________________ Pauletta Browns, MD yz:dd D: 02/14/2014 12:05:08 ET T: 02/14/2014 18:31:22 ET JOB#: [id number]  cc: Pauletta Browns, MD, <Dictator> Pauletta Browns MD ELECTRONICALLY SIGNED 03/02/2014 14:54

## 2014-12-11 NOTE — Op Note (Signed)
PATIENT NAME:  Bailey Ramirez, Bailey Ramirez MR#:  [id number] DATE OF BIRTH:  28-Sep-1928  DATE OF PROCEDURE:  02/12/2014  PREOPERATIVE DIAGNOSES: 1.  Chronic kidney disease with acute renal failure and volume overload, with need for hemodialysis. 2.  Asthma. 3.  Diabetes. 4.  Hypertension.   POSTOPERATIVE DIAGNOSES:  1.  Chronic kidney disease with acute renal failure and volume overload, with need for hemodialysis. 2.  Asthma. 3.  Diabetes. 4.  Hypertension.  PROCEDURES:  1. Ultrasound guidance for vascular access to the right femoral vein.  2. Placement of non-tunneled dialysis catheter into the right femoral vein.  SURGEON: Annice NeedyJason S. Dew, MD   ANESTHESIA: Local.   BLOOD LOSS: Minimal.   INDICATION FOR PROCEDURE: This 81 year old female has been in the hospital now for 10 days. Her renal function has not improved. She has become more confused and has volume overload, and now is to be started on hemodialysis. We are asked to place a dialysis catheter. Risks and benefits were discussed with her daughter, and informed consent was obtained.  DESCRIPTION OF THE PROCEDURE: The patient's right groin was sterilely prepped and draped and a sterile surgical field was created. The right femoral vein was visualized with ultrasound and found to be patent. It was then accessed under direct ultrasound guidance without difficulty with a Seldinger needle and a permanent image was recorded. A J-wire was placed after skin nick and dilatation. The 30 cm trialysis catheter was then placed over the wire and the wire was removed. All lumens withdrew blood well and flushed easily with sterile saline. It was secured to the skin with 3 nylon sutures. A sterile dressing was placed. The patient tolerated the procedure well and remained in his bed in a stable condition.  ____________________________ Annice NeedyJason S. Dew, MD jsd:cg D: 02/12/2014 17:00:00 ET T: 02/13/2014 07:01:05 ET JOB#: [id number]  cc: Annice NeedyJason S. Dew, MD,  <Dictator> Annice NeedyJASON S DEW MD ELECTRONICALLY SIGNED 02/22/2014 15:10

## 2014-12-11 NOTE — Consult Note (Signed)
PATIENT NAME:  Bailey Ramirez, Bailey Ramirez MR#:  [id number] DATE OF BIRTH:  08-Jul-1929  DATE OF CONSULTATION:  02/02/2014  REFERRING PHYSICIAN:  Katharina Caperima Vaickute, MD CONSULTING PHYSICIAN:  Lamar BlinksBruce J. Kowalski, MD  REASON FOR CONSULTATION: Acute on chronic diastolic dysfunction, congestive heart failure, respiratory failure, elevated BNP, cerebrovascular accident, chronic kidney disease, and abnormal EKG.   CHIEF COMPLAINT: The patient is obtunded and very short of breath with a BiPAP mask.  HISTORY OF PRESENT ILLNESS: This is an elderly female with known diastolic dysfunction and recurrent episodes of diastolic dysfunction and congestive heart failure who has had chronic kidney disease and creatinine of 1.9 and an old cerebrovascular accident having worsening episodes of shortness of breath, weight gain, lower extremity edema and respiratory failure. The patient did have hypoxia with an oxygenation of 75% which is now improved with BiPAP machine with an EKG showing sinus tachycardia and bundle branch block, which is old. The patient has had a BNP of 8012 and troponin of 0.08 consistent with demand ischemia and no current evidence of myocardial infarction and previous stroke. The patient now is more hemodynamically stable with appropriate medication management.  REVIEW OF SYSTEMS: The remainder of review cannot be assessed due to current obtundation.   PAST MEDICAL HISTORY: 1.  Diastolic dysfunction heart failure. 2.  Chronic kidney disease.  3.  Peripheral vascular disease with old cerebrovascular accident.   FAMILY HISTORY: No family members with early onset of cardiovascular disease or hypertension.   SOCIAL HISTORY: The patient has apparently no history of alcohol or tobacco use.   ALLERGIES: As listed.   MEDICATIONS: As listed.  PHYSICAL EXAMINATION: VITAL SIGNS: Blood pressure is 100/60 bilaterally and heart rate is 100 reclining and fast.  GENERAL: She is a ill-appearing elderly female in respiratory  distress.  HEENT: No apparent icterus, thyromegaly, ulcers, hemorrhage, or xanthelasma.  CARDIOVASCULAR: Regular rate and rhythm with diffuse heart sounds. PMI is also is diffuse. Carotid upstroke is normal without apparent bruit. Jugular venous pressure is elevated.  LUNGS: Have bibasilar crackles and decreased breath sounds with few rhonchi.  ABDOMEN: Soft. No apparent significant hepatosplenomegaly  EXTREMITIES: Shows 2+ radial, femoral, trace dorsal pedal pulses with 1+ lower extremity edema. No cyanosis, clubbing or ulcers.  NEUROLOGIC: The patient is obtunded.  ASSESSMENT: Elderly female with acute on chronic diastolic dysfunction, congestive heart failure with cardiorenal syndrome with chronic kidney disease, old cerebrovascular accident and elevated troponin consistent with demand ischemia.   RECOMMENDATIONS: 1.  Intravenous Lasix for continued acute on chronic systolic and/or diastolic dysfunction congestive heart failure.  2.  Serial ECG and enzymes to assess for possible myocardial infarction.  3.  Antiplatelet medication management for cerebrovascular accident in the past. 4.  Beta blocker for congestive heart failure if able watching for bradycardia and/or hypotension.  5.  Further treatment options after BiPAP machine and improved patient's oxygenation.  ____________________________ Lamar BlinksBruce J. Kowalski, MD bjk:sb D: 02/02/2014 13:35:29 ET T: 02/02/2014 13:56:10 ET JOB#: [id number]  cc: Lamar BlinksBruce J. Kowalski, MD, <Dictator> Lamar BlinksBRUCE J KOWALSKI MD ELECTRONICALLY SIGNED 02/02/2014 16:57

## 2015-03-28 IMAGING — CR DG LUMBAR SPINE 2-3V
1 series · 3 of 3 positions shown · non-contrast
Comparison: 12/31/2013

CLINICAL DATA: Disc removed in lower back

EXAM:
LUMBAR SPINE - 2-3 VIEW

[Series 2: t lumbar spine ap · 0.14mm/px · 3 of 3 slices shown]
[im 1/3]
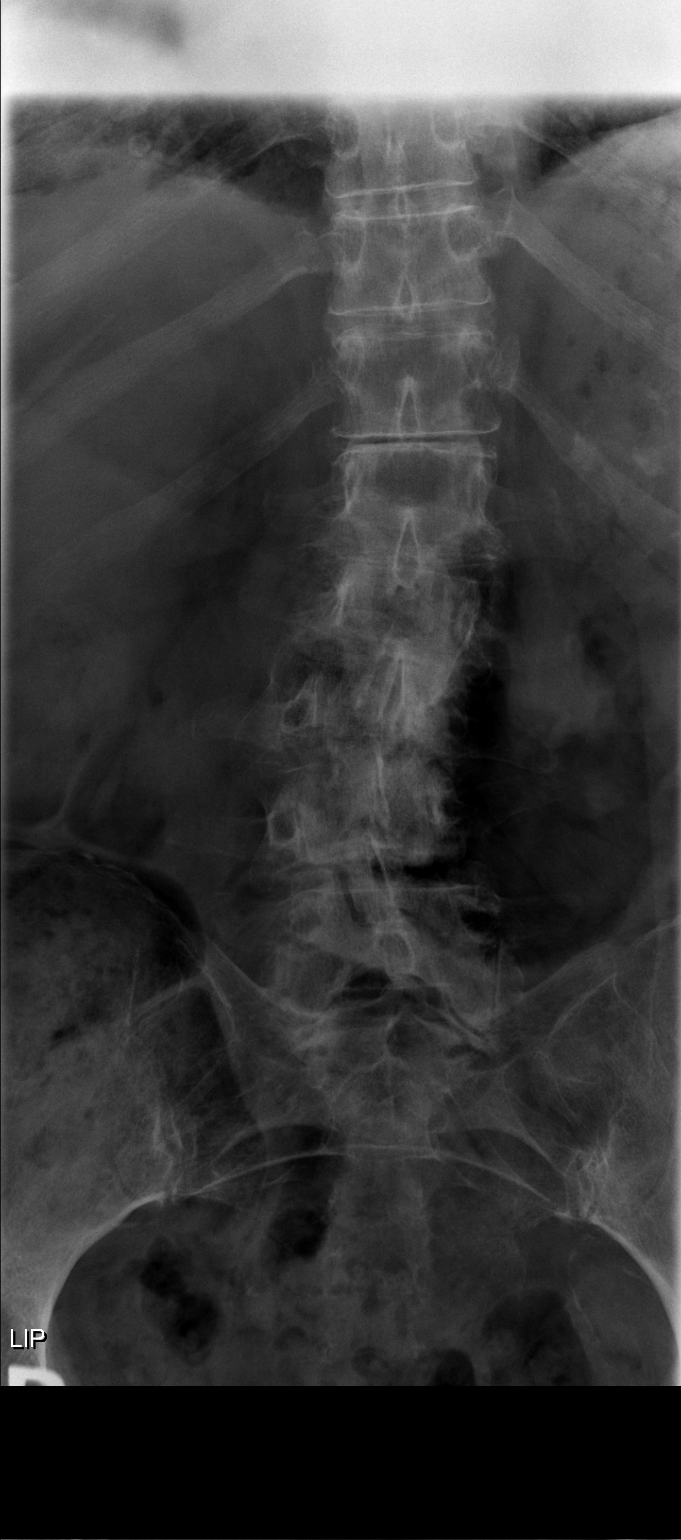
[im 2/3]
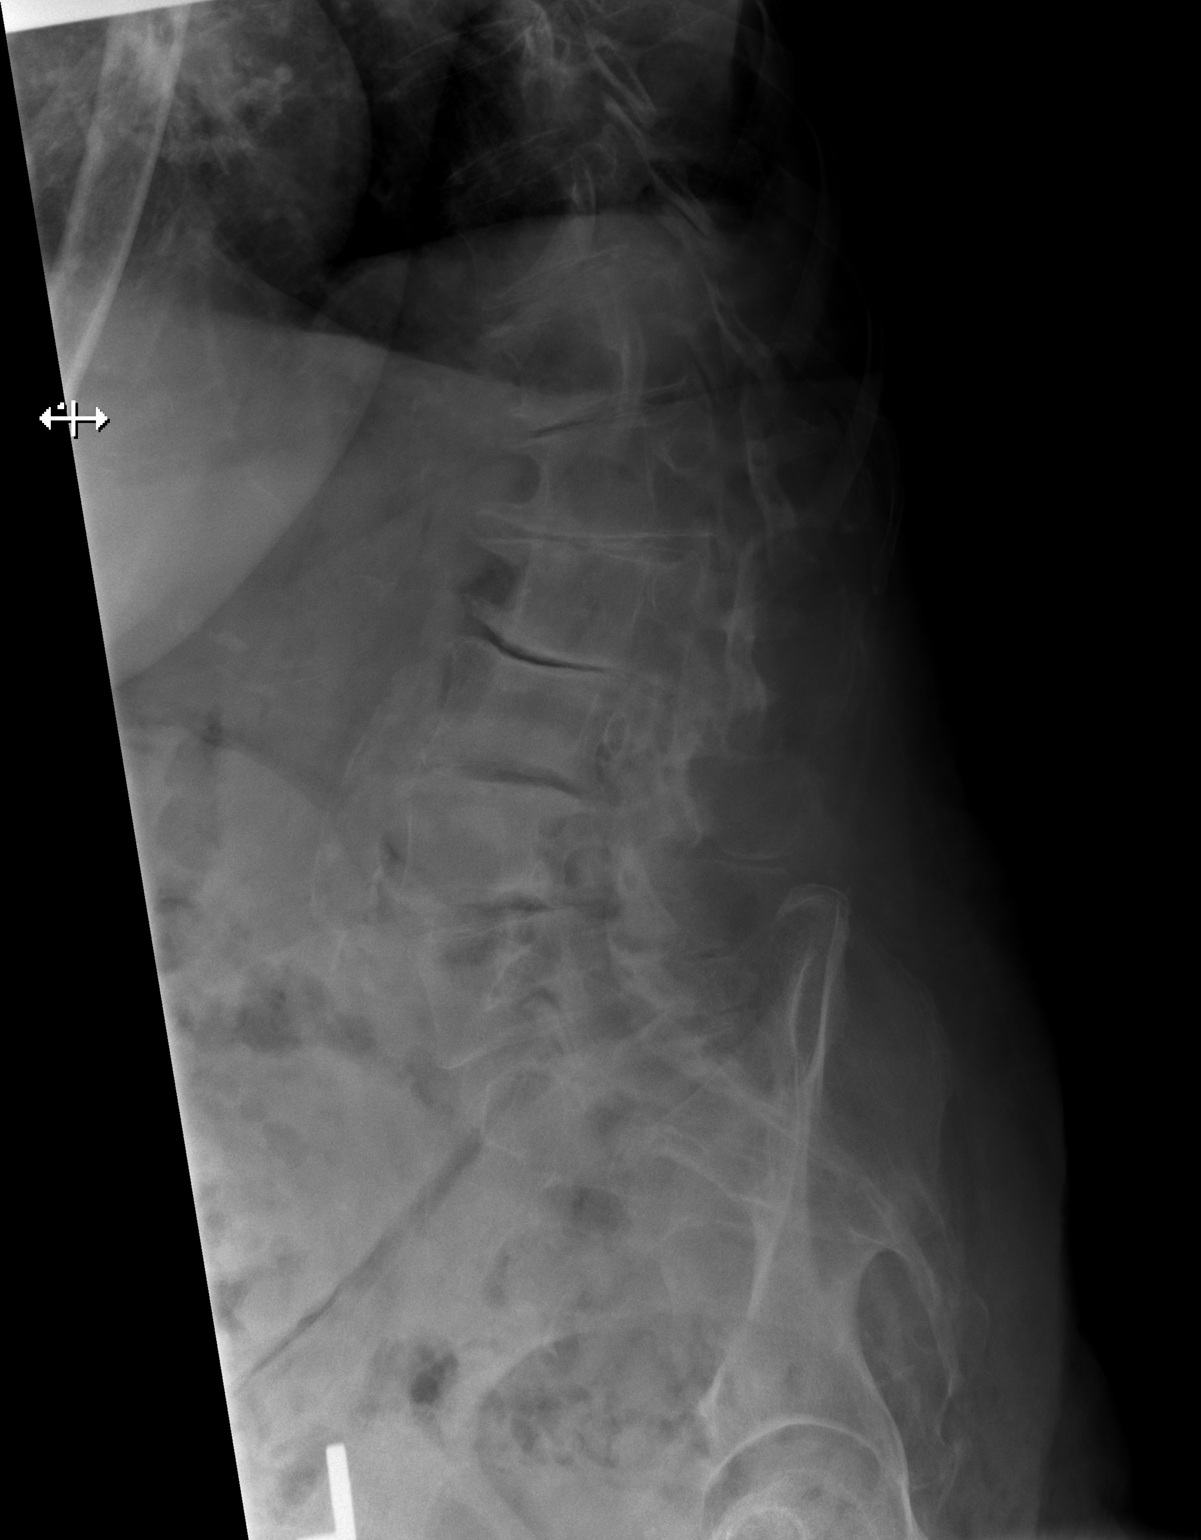
[im 3/3]
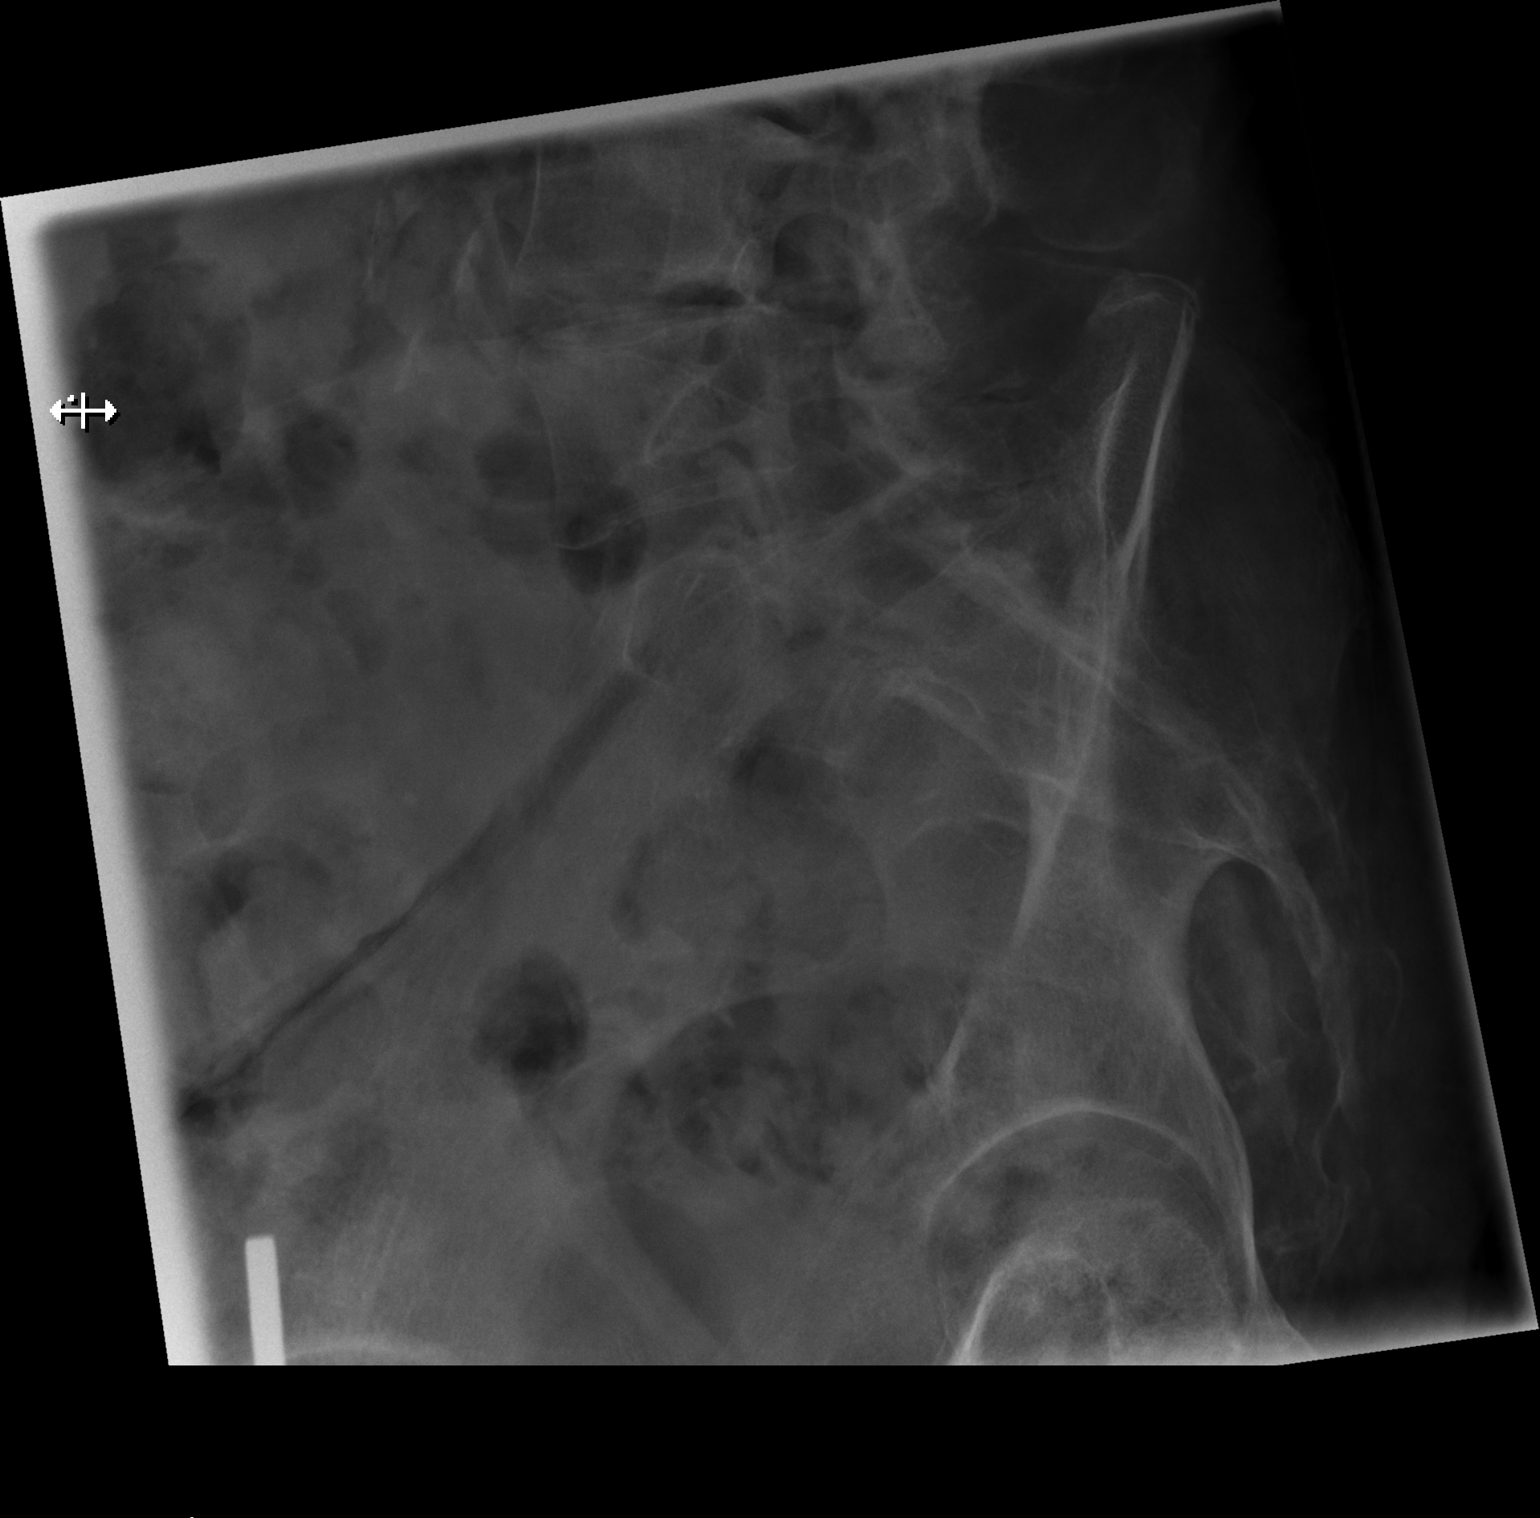

[3 of 3 positions shown; findings below may reference images not displayed]

FINDINGS: There is levoscoliosis lumbar spine. There is significant joint
space narrowing at T12-L1, L1-L2, L2-L3 with significant
osteophytosis. There is no significant subluxation. No change from
prior.
IMPRESSION: 1. No acute findings lumbar spine.
2. Severe disc osteophytic disease with joint space narrowing.

## 2015-03-28 IMAGING — CR DG CHEST 1V PORT
1 series · 1 of 1 positions shown · non-contrast
Comparison: 02/04/2009

CLINICAL DATA: CHF

EXAM:
PORTABLE CHEST - 1 VIEW

[ap]
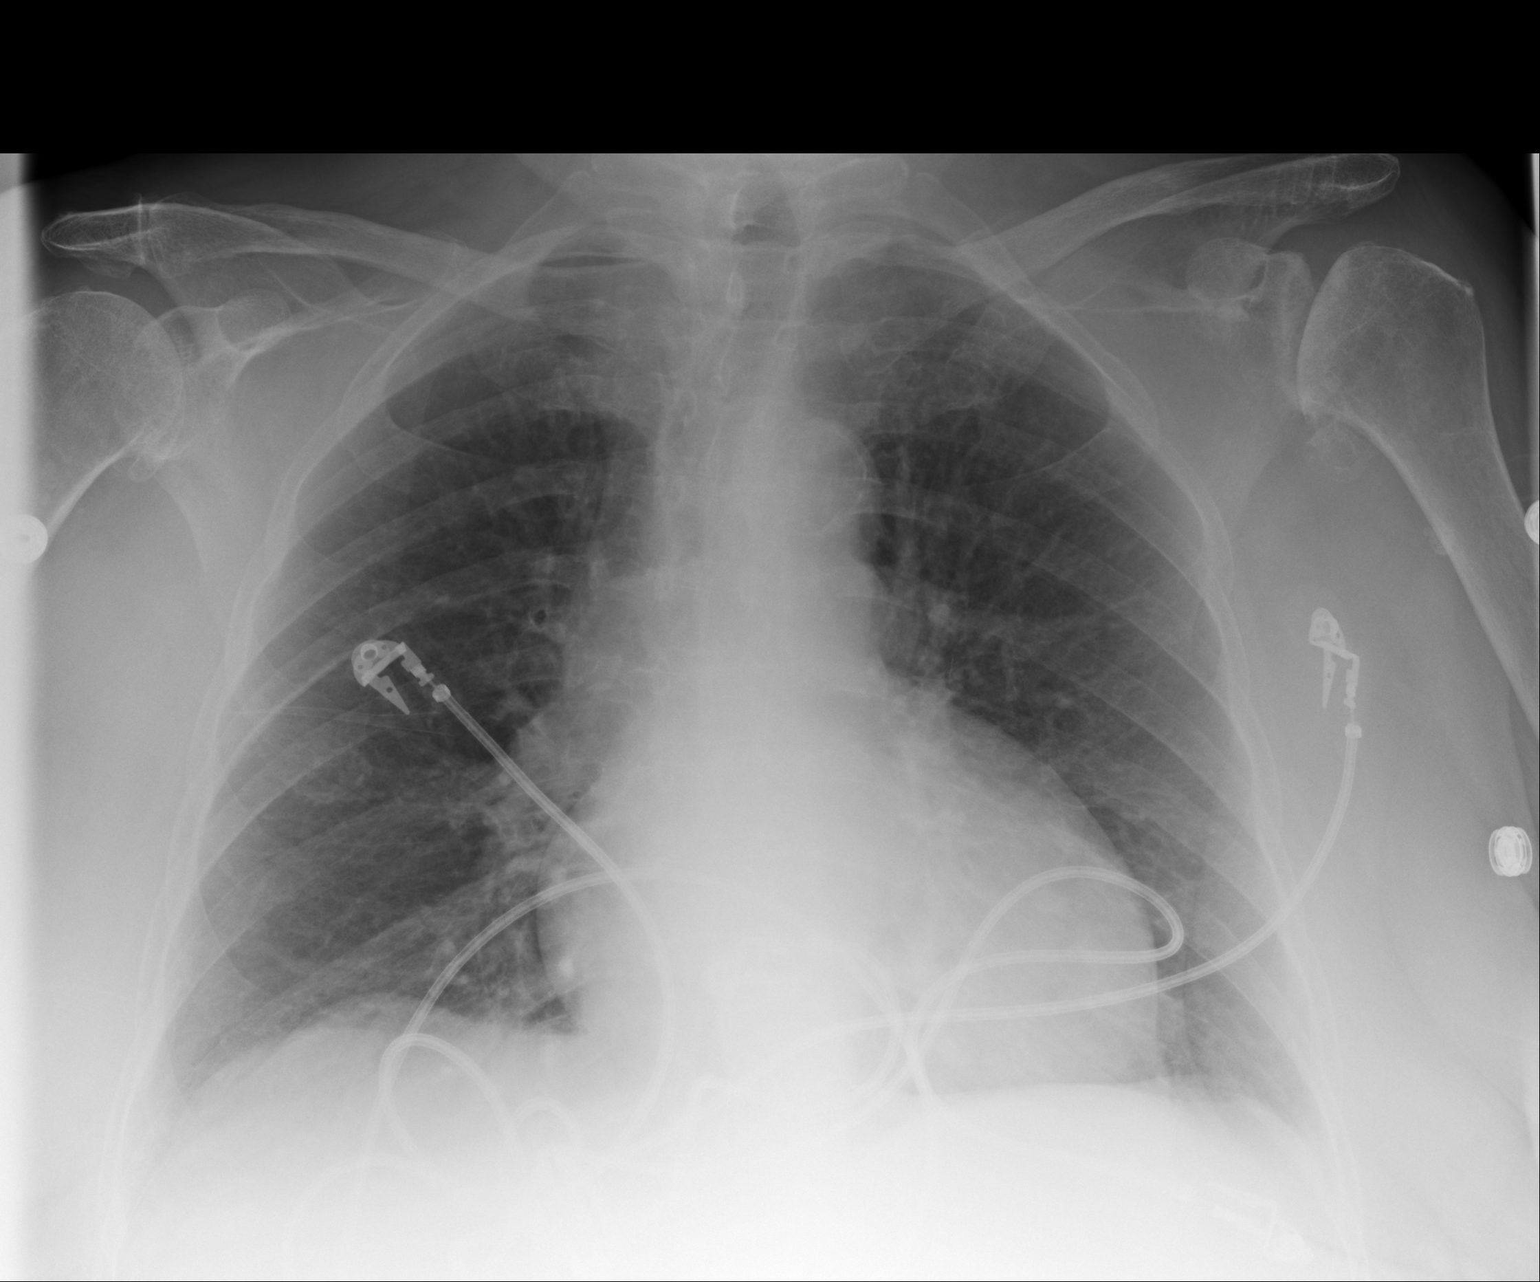

[1 of 1 positions shown; findings below may reference images not displayed]

FINDINGS: Cardiac shadow is stable. The lungs are well aerated bilaterally. No
focal infiltrate or sizable effusion is seen. Degenerative changes
of left shoulder joint are noted.
IMPRESSION: No acute infiltrate is identified. No significant vascular
congestion is noted.

## 2015-04-03 IMAGING — CT CT HEAD WITHOUT CONTRAST
1 series · 15 of 30 positions shown, 19 images · non-contrast
Comparison: 12/31/2013 and earlier.

CLINICAL DATA: 84-year-old female with altered mental status.
Initial encounter.

EXAM:
CT HEAD WITHOUT CONTRAST
TECHNIQUE: Contiguous axial images were obtained from the base of the skull
through the vertex without intravenous contrast.

[Series 2: head wo · axial · 0.49mm/px · z∈[-154,-19]mm · 15 of 31 slices shown, 19 images]
[im 2/31  brain]
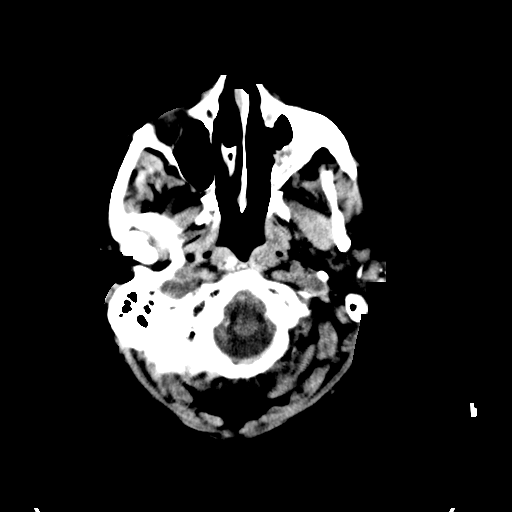
[im 2/31  bone]
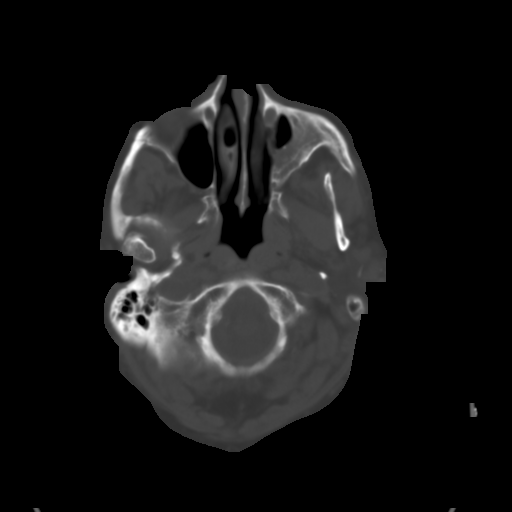
[im 4/31  brain]
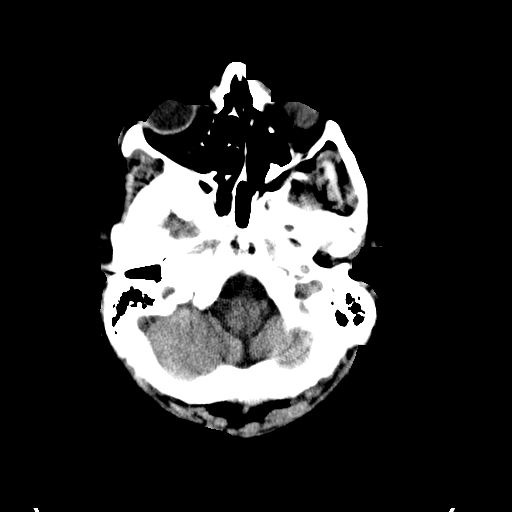
[im 6/31  brain]
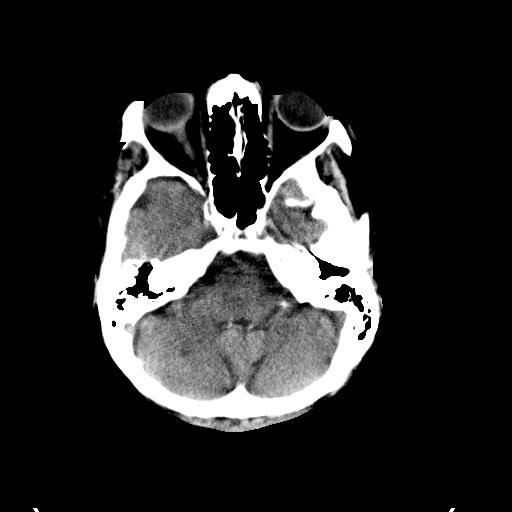
[im 8/31  brain]
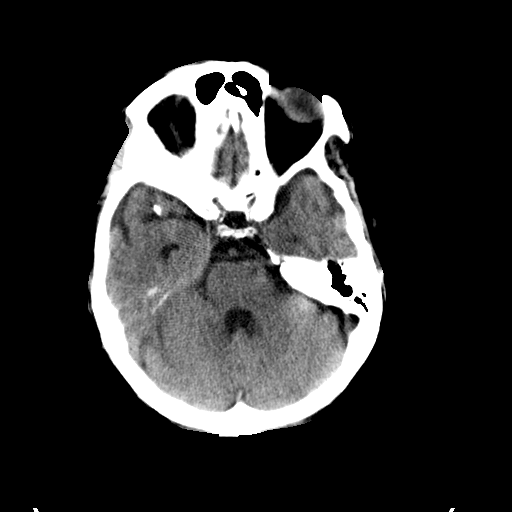
[im 10/31  brain]
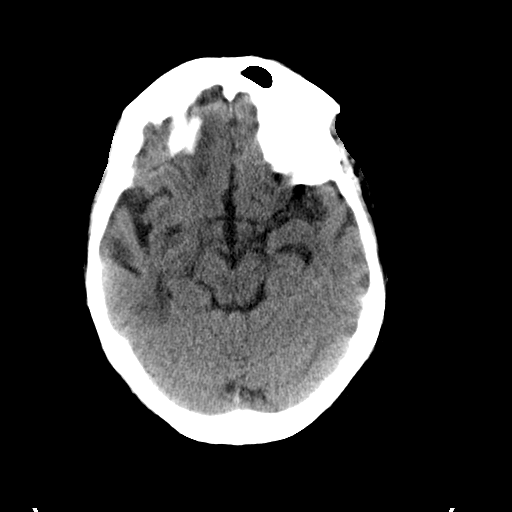
[im 10/31  bone]
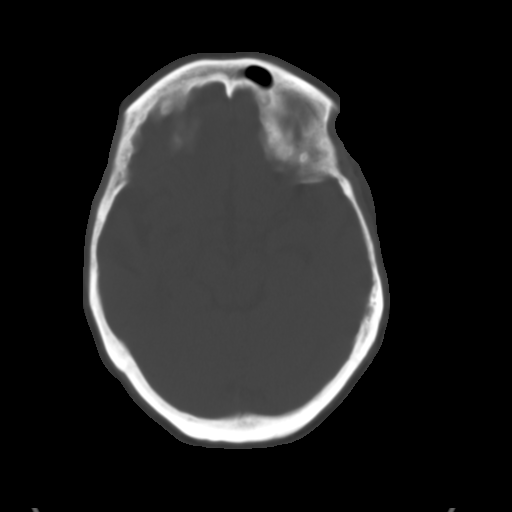
[im 12/31  brain]
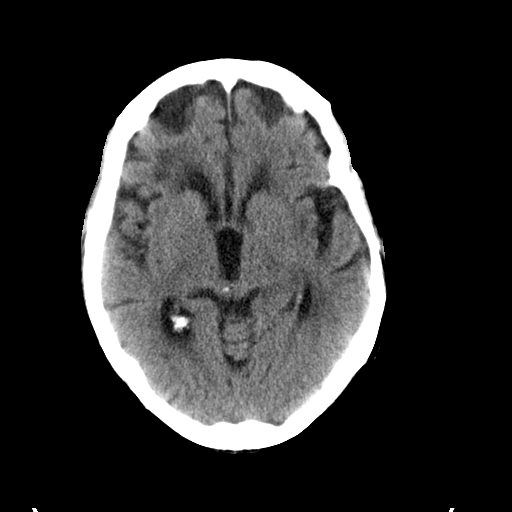
[im 14/31  brain]
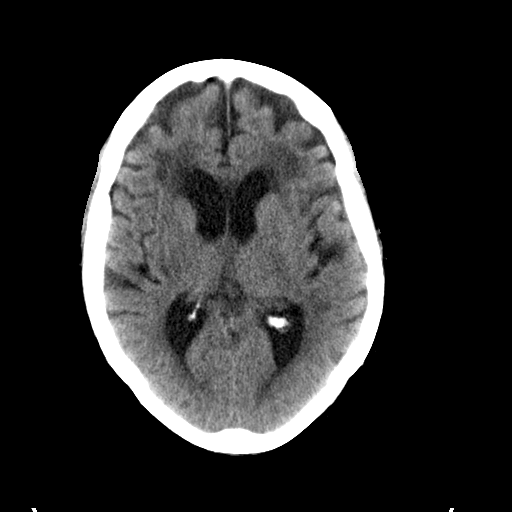
[im 16/31  brain]
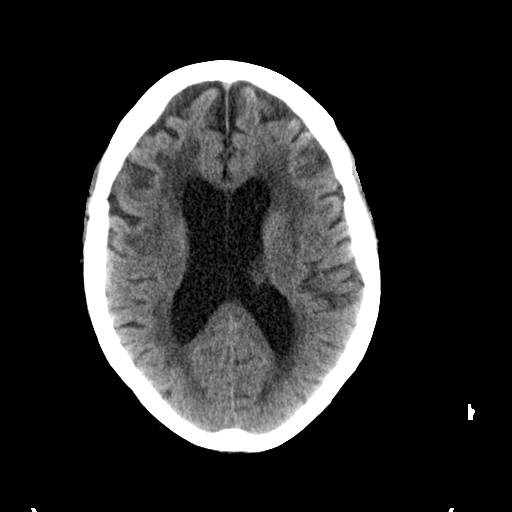
[im 17/31  brain]
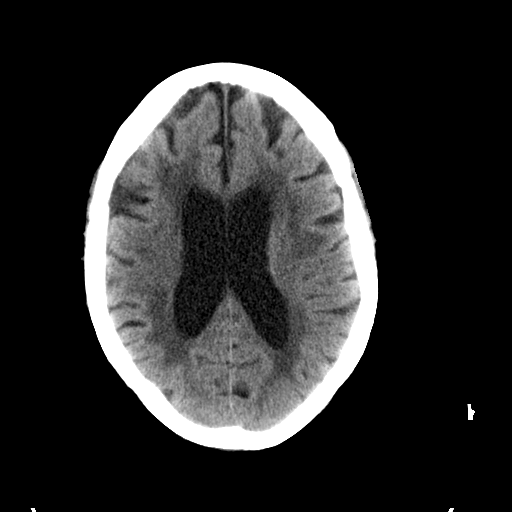
[im 17/31  bone]
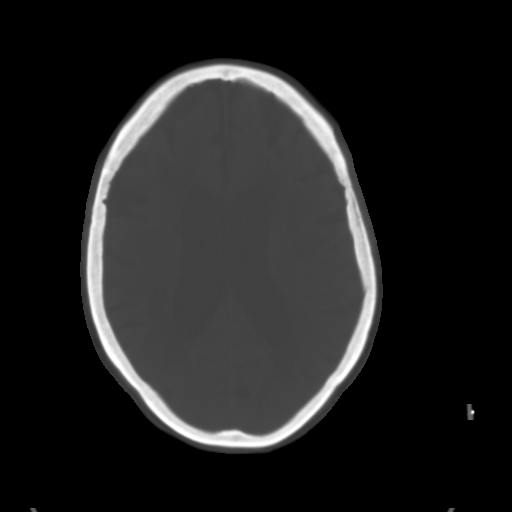
[im 19/31  brain]
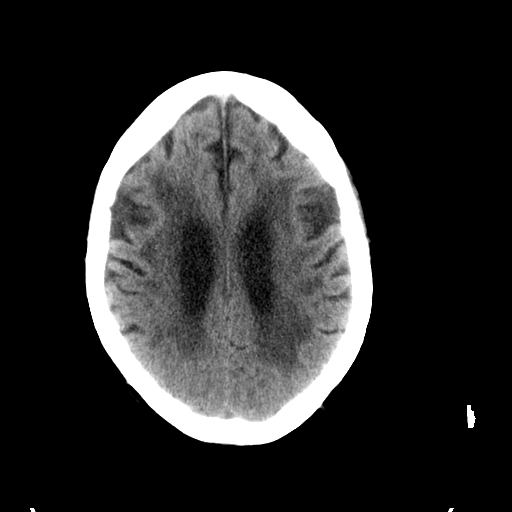
[im 21/31  brain]
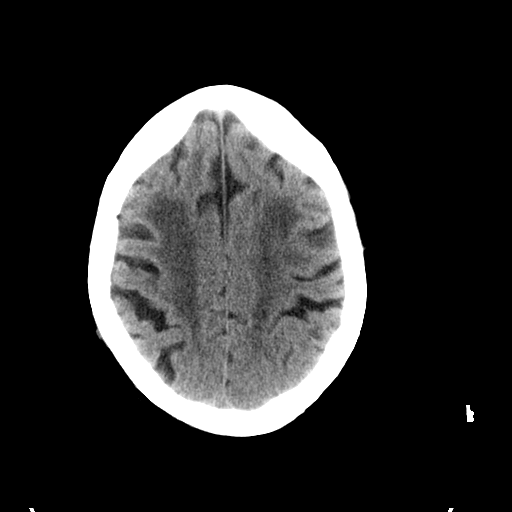
[im 23/31  brain]
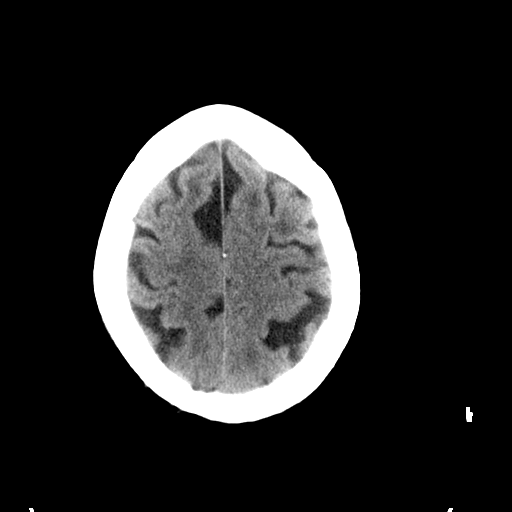
[im 25/31  brain]
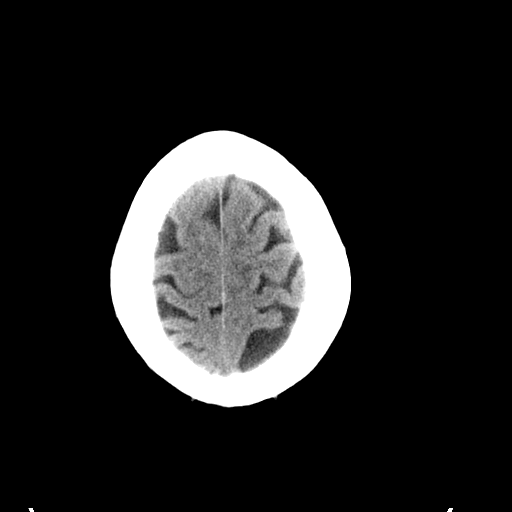
[im 25/31  bone]
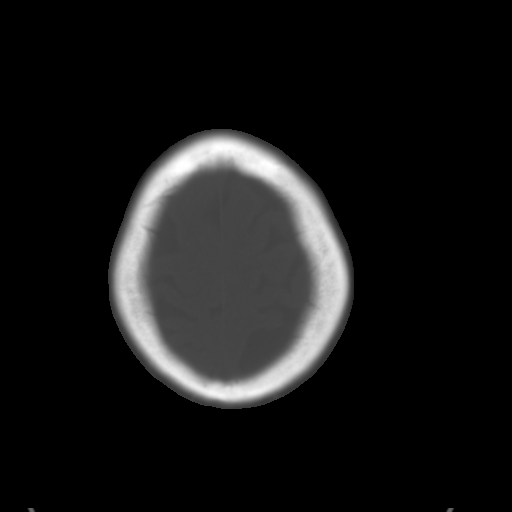
[im 27/31  brain]
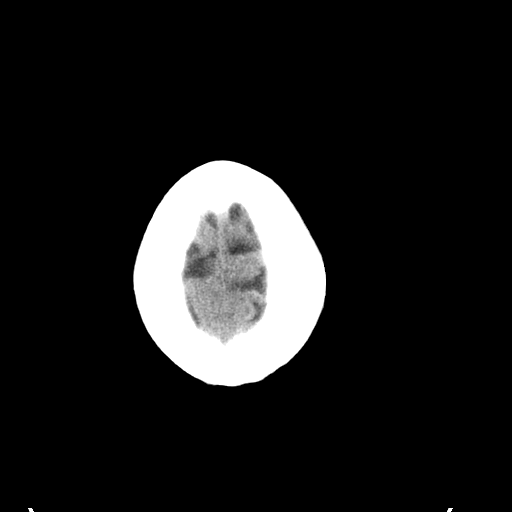
[im 29/31  brain]
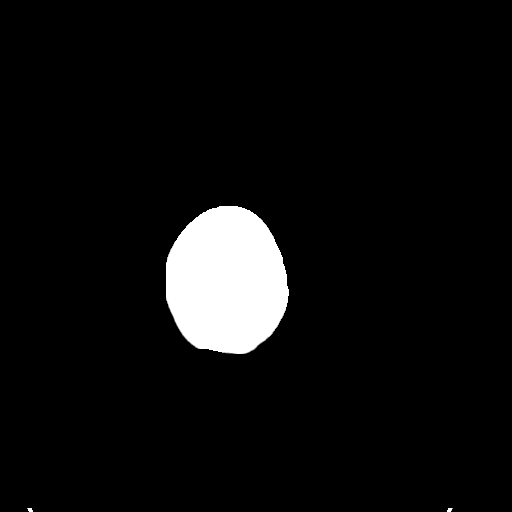

[15 of 30 positions shown; findings below may reference images not displayed]

FINDINGS: Chronic left maxillary sinusitis with pronounced mucoperiosteal
thickening. Other Visualized paranasal sinuses and mastoids are
clear. No acute osseous abnormality identified. No acute orbit or
scalp soft tissue findings.

Calcified atherosclerosis at the skull base. This includes a bulbous
calcification at the level of the right MCA bifurcation encompassing
8 mm, stable since 4000.

Only mild additional cerebral volume loss since 4000. No
ventriculomegaly. No midline shift, mass effect, or evidence of
intracranial mass lesion. Small chronic right inferior cerebellar
infarct appears stable. Confluent cerebral white matter hypodensity
has not significantly changed since 4000. No evidence of cortically
based acute infarction identified. No acute intracranial hemorrhage
identified.
IMPRESSION: 1.  No acute intracranial abnormality.
2. Strong evidence of a chronic right MCA bifurcation aneurysm
measuring at least 8 mm, which is partially calcified and grossly
stable since 4000. Head MRA or CTA would confirm.
3. Advanced but nonspecific white matter changes, favor chronic
small vessel disease in light of small chronic right cerebellar
infarct.
